# Patient Record
Sex: Female | Born: 1965
Health system: Southern US, Community
[De-identification: ages and names within clinical notes are randomized; demographics above are authoritative.]

## PROBLEM LIST (undated history)

## (undated) DIAGNOSIS — E079 Disorder of thyroid, unspecified: Secondary | ICD-10-CM

---

## 2014-10-30 ENCOUNTER — Other Ambulatory Visit: Payer: Self-pay | Admitting: Obstetrics and Gynecology

## 2014-10-30 DIAGNOSIS — N83299 Other ovarian cyst, unspecified side: Secondary | ICD-10-CM

## 2014-10-30 DIAGNOSIS — N852 Hypertrophy of uterus: Secondary | ICD-10-CM

## 2014-10-31 ENCOUNTER — Other Ambulatory Visit: Payer: Self-pay

## 2014-11-12 ENCOUNTER — Other Ambulatory Visit: Payer: Self-pay

## 2015-03-03 ENCOUNTER — Other Ambulatory Visit: Payer: Self-pay

## 2015-03-06 ENCOUNTER — Ambulatory Visit
Admission: RE | Admit: 2015-03-06 | Discharge: 2015-03-06 | Disposition: A | Discharge status: Home / Self Care | Payer: 59 | Source: Ambulatory Visit | Attending: Obstetrics and Gynecology | Admitting: Obstetrics and Gynecology

## 2015-03-06 DIAGNOSIS — N83299 Other ovarian cyst, unspecified side: Secondary | ICD-10-CM

## 2015-03-06 DIAGNOSIS — N852 Hypertrophy of uterus: Secondary | ICD-10-CM

## 2015-03-06 MED ORDER — IOPAMIDOL (ISOVUE-300) INJECTION 61%
100.0000 mL | Freq: Once | INTRAVENOUS | Status: AC | PRN
  Administered 2015-03-06: 100 mL via INTRAVENOUS

## 2015-04-28 ENCOUNTER — Other Ambulatory Visit: Payer: Self-pay | Admitting: Internal Medicine

## 2015-04-28 DIAGNOSIS — E041 Nontoxic single thyroid nodule: Secondary | ICD-10-CM

## 2015-04-28 DIAGNOSIS — E049 Nontoxic goiter, unspecified: Secondary | ICD-10-CM

## 2015-05-01 ENCOUNTER — Ambulatory Visit
Admission: RE | Admit: 2015-05-01 | Discharge: 2015-05-01 | Disposition: A | Discharge status: Home / Self Care | Payer: 59 | Source: Ambulatory Visit | Attending: Internal Medicine | Admitting: Internal Medicine

## 2015-05-01 DIAGNOSIS — E049 Nontoxic goiter, unspecified: Secondary | ICD-10-CM

## 2015-05-01 DIAGNOSIS — E041 Nontoxic single thyroid nodule: Secondary | ICD-10-CM

## 2015-05-14 ENCOUNTER — Other Ambulatory Visit: Payer: Self-pay | Admitting: Internal Medicine

## 2015-05-14 DIAGNOSIS — E041 Nontoxic single thyroid nodule: Secondary | ICD-10-CM

## 2015-05-20 ENCOUNTER — Ambulatory Visit
Admission: RE | Admit: 2015-05-20 | Discharge: 2015-05-20 | Disposition: A | Discharge status: Home / Self Care | Payer: 59 | Source: Ambulatory Visit | Attending: Internal Medicine | Admitting: Internal Medicine

## 2015-05-20 ENCOUNTER — Other Ambulatory Visit (HOSPITAL_COMMUNITY)
Admission: RE | Admit: 2015-05-20 | Discharge: 2015-05-20 | Disposition: A | Discharge status: Home / Self Care | Payer: 59 | Source: Ambulatory Visit | Attending: Interventional Radiology | Admitting: Interventional Radiology

## 2015-05-20 DIAGNOSIS — E041 Nontoxic single thyroid nodule: Secondary | ICD-10-CM

## 2015-11-10 ENCOUNTER — Emergency Department (HOSPITAL_COMMUNITY)
Admission: EM | Admit: 2015-11-10 | Discharge: 2015-11-10 | Disposition: A | Discharge status: Home / Self Care | Payer: 59 | Attending: Emergency Medicine | Admitting: Emergency Medicine

## 2015-11-10 ENCOUNTER — Encounter (HOSPITAL_COMMUNITY): Payer: Self-pay

## 2015-11-10 DIAGNOSIS — Z041 Encounter for examination and observation following transport accident: Secondary | ICD-10-CM | POA: Insufficient documentation

## 2015-11-10 DIAGNOSIS — Z8639 Personal history of other endocrine, nutritional and metabolic disease: Secondary | ICD-10-CM | POA: Insufficient documentation

## 2015-11-10 DIAGNOSIS — Y998 Other external cause status: Secondary | ICD-10-CM | POA: Insufficient documentation

## 2015-11-10 DIAGNOSIS — Y9389 Activity, other specified: Secondary | ICD-10-CM | POA: Diagnosis not present

## 2015-11-10 DIAGNOSIS — Y92410 Unspecified street and highway as the place of occurrence of the external cause: Secondary | ICD-10-CM | POA: Insufficient documentation

## 2015-11-10 HISTORY — DX: Disorder of thyroid, unspecified: E07.9

## 2015-11-10 MED ORDER — DIAZEPAM 5 MG PO TABS: 5.0000 mg | ORAL_TABLET | Freq: Two times a day (BID) | ORAL | Status: DC

## 2015-11-10 MED ORDER — NAPROXEN 250 MG PO TABS
500.0000 mg | ORAL_TABLET | Freq: Once | ORAL | Status: DC
  Filled 2015-11-10: qty 2

## 2015-11-10 MED ORDER — NAPROXEN 500 MG PO TABS: 500.0000 mg | ORAL_TABLET | Freq: Two times a day (BID) | ORAL | Status: DC
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
## 2018-05-28 DIAGNOSIS — Z1231 Encounter for screening mammogram for malignant neoplasm of breast: Secondary | ICD-10-CM | POA: Diagnosis not present

## 2018-06-20 DIAGNOSIS — Z01419 Encounter for gynecological examination (general) (routine) without abnormal findings: Secondary | ICD-10-CM | POA: Diagnosis not present

## 2018-06-20 DIAGNOSIS — N898 Other specified noninflammatory disorders of vagina: Secondary | ICD-10-CM | POA: Diagnosis not present

## 2018-06-20 DIAGNOSIS — Z6830 Body mass index (BMI) 30.0-30.9, adult: Secondary | ICD-10-CM | POA: Diagnosis not present

## 2018-06-20 DIAGNOSIS — Z1151 Encounter for screening for human papillomavirus (HPV): Secondary | ICD-10-CM | POA: Diagnosis not present

## 2018-06-20 DIAGNOSIS — Z131 Encounter for screening for diabetes mellitus: Secondary | ICD-10-CM | POA: Diagnosis not present

## 2018-06-20 DIAGNOSIS — Z13220 Encounter for screening for lipoid disorders: Secondary | ICD-10-CM | POA: Diagnosis not present

## 2018-06-20 DIAGNOSIS — Z130 Encounter for screening for diseases of the blood and blood-forming organs and certain disorders involving the immune mechanism: Secondary | ICD-10-CM | POA: Diagnosis not present

## 2018-06-20 DIAGNOSIS — Z1329 Encounter for screening for other suspected endocrine disorder: Secondary | ICD-10-CM | POA: Diagnosis not present

## 2018-06-20 DIAGNOSIS — Z0000 Encounter for general adult medical examination without abnormal findings: Secondary | ICD-10-CM | POA: Diagnosis not present

## 2018-06-20 DIAGNOSIS — N760 Acute vaginitis: Secondary | ICD-10-CM | POA: Diagnosis not present

## 2018-07-16 DIAGNOSIS — M79672 Pain in left foot: Secondary | ICD-10-CM | POA: Diagnosis not present

## 2018-07-16 DIAGNOSIS — M79671 Pain in right foot: Secondary | ICD-10-CM | POA: Diagnosis not present

## 2018-07-16 DIAGNOSIS — M722 Plantar fascial fibromatosis: Secondary | ICD-10-CM | POA: Diagnosis not present

## 2018-08-09 DIAGNOSIS — N83201 Unspecified ovarian cyst, right side: Secondary | ICD-10-CM | POA: Diagnosis not present

## 2018-08-09 DIAGNOSIS — D279 Benign neoplasm of unspecified ovary: Secondary | ICD-10-CM | POA: Diagnosis not present

## 2018-08-20 DIAGNOSIS — M722 Plantar fascial fibromatosis: Secondary | ICD-10-CM | POA: Diagnosis not present

## 2018-08-20 DIAGNOSIS — M79671 Pain in right foot: Secondary | ICD-10-CM | POA: Diagnosis not present

## 2018-08-20 DIAGNOSIS — M79672 Pain in left foot: Secondary | ICD-10-CM | POA: Diagnosis not present

## 2018-10-29 ENCOUNTER — Ambulatory Visit (INDEPENDENT_AMBULATORY_CARE_PROVIDER_SITE_OTHER): Payer: BLUE CROSS/BLUE SHIELD | Admitting: Internal Medicine

## 2018-10-29 ENCOUNTER — Encounter (INDEPENDENT_AMBULATORY_CARE_PROVIDER_SITE_OTHER): Payer: Self-pay

## 2018-10-29 ENCOUNTER — Other Ambulatory Visit: Payer: Self-pay

## 2018-10-29 ENCOUNTER — Encounter: Payer: Self-pay | Admitting: Internal Medicine

## 2018-10-29 DIAGNOSIS — E041 Nontoxic single thyroid nodule: Secondary | ICD-10-CM | POA: Diagnosis not present

## 2018-10-29 DIAGNOSIS — E063 Autoimmune thyroiditis: Secondary | ICD-10-CM | POA: Diagnosis not present

## 2018-10-29 NOTE — Patient Instructions (Signed)
Please come back for labs as soon as safe.  I will order a thyroid ultrasound for you.  Please join My chart and I will send you the results through there.  Please come back for a follow-up appointment in 6 months.

## 2018-10-29 NOTE — Progress Notes (Addendum)
Patient ID: Rachel FogoDorothea Spurgeon, female   DOB: 06/07/1966, 53 y.o.   MRN: [id number]   Patient location: Work My location: Office  Referring Provider: Self-referred  I connected with the patient on 10/29/18 at  1:15 PM EDT by a video enabled telemedicine application and verified that I am speaking with the correct person.   I discussed the limitations of evaluation and management by telemedicine and the availability of in person appointments. The patient expressed understanding and agreed to proceed.   Details of the encounter are shown below.   HPI  Rachel Carpenter is a 53 y.o.-year-old female, self-referred, for evaluation and management of a thyroid nodule and euthyroid Hashimoto's thyroiditis.  She was previously seen by Dr. Margaretmary BayleyPreston Clark, who retired a few years ago. She moved to [place] in 2014.  She was diagnosed with thyroid nodules in 2007 by PCP - initially palpated.  Reviewed available study reports: Thyroid U/S (05/01/2015):  Right thyroid lobe: 6.1 x 2.9 x 3.0 cm. Complex predominantly solid lower pole mass measuring 4.0 x 2.9 x 3.1 cm. Left thyroid lobe: 4.1 x 1.5 x 1.4 cm.  No nodules. Isthmus: Thickness 3 mm.  No nodules visualized No lymphadenopathy.    05/20/2015: FNA of the right lower pole thyroid nodule: Benign  Pt denies: - feeling nodules in neck - dysphagia - choking - SOB with lying down But has - hoarseness - ? Allergies.  I reviewed pt's thyroid tests: 05/14/2015: TSH 0.907, total T3 92 (71-180), thyroglobulin 190.8 (1.5-38.5) No results found for: TSH, FREET4   Patient also has Hashimoto's thyroiditis:  05/14/2015: TPO antibodies 246 (0-34), antithyroglobulin antibodies <1.0)  Pt mentions: - no fatigue - + heat intolerance/+ cold intolerance - no tremors - no  palpitations - no anxiety/depression - no hyperdefecation/constipation - no weight loss/weight gain - no dry skin - no hair loss  No FH of thyroid ds. No FH of thyroid cancer. + FH of SLE  in mother. No h/o radiation tx to head or neck.  No seaweed or kelp. No recent contrast studies. No steroid use. No herbal supplements. No Biotin supplements or Hair, Skin and Nails vitamins.  Pt also has a history of HL; R rotator cuff tear; anemia; dermoid cyst on R ovary. She is followed by Dr. Cherly Hensenousins.  She has plantar fasciitis. Sees podiatry.  ROS: Constitutional: + See HPI  Eyes: no blurry vision, no xerophthalmia ENT: no sore throat,  + see HPI Cardiovascular: no CP/SOB/palpitations/+ leg swelling Respiratory: no cough/SOB Gastrointestinal: no N/V/D/C Musculoskeletal: + muscle pain - exercising more/no joint aches, + bone pain Skin: no rashes Neurological: no tremors/numbness/tingling/dizziness Psychiatric: no depression/anxiety  Past Medical History:  Diagnosis Date  . Thyroid disease    No past surgical history on file. Social History   Socioeconomic History  . Marital status: Single    Spouse name: Not on file  . Number of children: 1 son - healthy  . Years of education: Not on file  . Highest education level: Not on file  Occupational History  . Quality manager  Tobacco Use  . Smoking status: Never Smoker  . Smokeless tobacco: Never Used  Substance and Sexual Activity  . Alcohol use: No  . Drug use: No   She exercises 4-5x a week.  Pt is not on any medicines.  No Known Allergies   Family History  Problem Relation Age of Onset  . Hypertension Mother   . Hypertension Father   . Cancer Father   . Hypertension Brother   .  Diabetes Maternal Grandmother   . Hypertension Maternal Grandmother   . Cancer Maternal Grandfather    Please see pertinent FH in HPI.  PE: There were no vitals taken for this visit. Wt Readings from Last 3 Encounters:  11/10/15 182 lb (82.6 kg)   Constitutional:  in NAD  The physical exam was not performed (virtual visit).  ASSESSMENT: 1. Thyroid nodule  2. Hashimoto's thyroiditis  PLAN: 1. Thyroid nodule - I  reviewed the images of her thyroid ultrasound. Tthe dominant nodule is large, this being a risk factor for cancer.  Otherwise, the nodules are: - Mixed: solid + cystic, and hyper + hypoechoic - without microcalcifications - It has some internal blood flow, but not much increased compared to the background - more wide than tall - well delimited from surrounding tissue Pt does not have a thyroid cancer family history or a personal history of RxTx to head/neck. All these would favor benignity.  - The fact that she already had a benign thyroid biopsy is very encouraging - We discussed that we need to repeat an ultrasound and I will order this today.  If the nodule appears unchanged, I would not suggest a new FNA, but we may proceed with this if the nodule appears larger or if it changed ultrasound characteristics. - If the nodule did not change, she should let me know if she develops neck compression symptoms, in that case, we might need to do either lobectomy or thyroidectomy - I will be in touch regarding the thyroid ultrasound results, otherwise, I will see her back in 6 months.  2. Hashimoto's thyroiditis - we had a long discussion about her Hashimoto thyroiditis diagnosis. I explained that this is an autoimmune disorder, in which she develops antibodies against her own thyroid. The antibodies bind to the thyroid tissue and cause inflammation, and, eventually, destruction of the gland and hypothyroidism. We don't know how long this process can be, it can last from months to years. As of now, based on the last results that I have, her thyroid tests are normal.  - thyroid enlargement especially at the beginning of her Hashimoto thyroiditis course is not uncommon, and it has a waxing and waning character.  In her ultrasound report, the right lobe appears larger, but this is most likely due to her large right nodule. - We discussed about treatment for Hashimoto thyroiditis, which is actually limited to  thyroid hormones in case her TFTs are abnormal. Supplements like selenium has been tried with various results, some showing improvement in the TPO antibodies. However, there are no randomized controlled trials of this are consistent results between trials. - We we will check labs when safe to come to the clinic - I advised pt to join my chart and I will send her the results through there   Component     Latest Ref Rng & Units 11/01/2018  Thyroglobulin Ab     < or = 1 IU/mL <1  Thyroperoxidase Ab SerPl-aCnc     <9 IU/mL 853 (H)  Triiodothyronine,Free,Serum     2.3 - 4.2 pg/mL 3.4  T4,Free(Direct)     0.60 - 1.60 ng/dL 9.64  TSH     3.83 - 8.18 uIU/mL 0.77   TFTs are still normal, but her thyroid peroxidase antibodies are higher. Will advise her to try selenium 200 mcg daily. We will recheck her antibodies when she comes back to the clinic in 6 months  Thyroid ultrasound (12/12/2018):  COMPARISON:  05/01/2015; ultrasound-guided right-sided  thyroid nodule fine-needle aspiration-05/20/2015  FINDINGS: Parenchymal Echotexture: Mildly heterogenous Isthmus: Normal in size measures 0.3 cm in diameter, unchanged Right lobe: Enlarged measuring 6.2 x 3.2 x 2.4 cm, unchanged, previously, 6.1 x 2.9 x 3.0 cm Left lobe: Normal in size measuring 4.1 x 1.4 x 1.2 cm, unchanged, previously, 4.1 x 1.5 x 1.4 cm  The previously biopsied approximately 4.0 x 3.3 x 2.7 cm partially cystic though predominantly solid nodule/mass replacing the mid and inferior aspect the right lobe of the thyroid are unchanged compared to the 04/2015 examination, previously, 4.0 x 3.1 x 2.9 cm. Correlation with prior biopsy results is recommended.  IMPRESSION: 1. No new or enlarging thyroid nodules. 2. Previously biopsied nodule/mass replacing the mid and inferior aspect the right lobe of the thyroid are unchanged compared to the 04/2015 examination. Correlation with prior biopsy results is recommended. Assuming a benign  pathologic diagnosis, repeat sampling and/or continued dedicated follow-up is not recommended.  The above is in keeping with the ACR TI-RADS recommendations - J Am Coll Radiol 2017;14:587-595.  Carlus Pavlov, MD PhD [facility]

## 2018-11-01 ENCOUNTER — Other Ambulatory Visit (INDEPENDENT_AMBULATORY_CARE_PROVIDER_SITE_OTHER): Payer: BLUE CROSS/BLUE SHIELD

## 2018-11-01 ENCOUNTER — Encounter: Payer: Self-pay | Admitting: Internal Medicine

## 2018-11-01 ENCOUNTER — Other Ambulatory Visit: Payer: Self-pay

## 2018-11-01 DIAGNOSIS — E063 Autoimmune thyroiditis: Secondary | ICD-10-CM

## 2018-11-01 LAB — T4, FREE: Free T4: 0.83 ng/dL (ref 0.60–1.60)

## 2018-11-01 LAB — TSH: TSH: 0.77 u[IU]/mL (ref 0.35–4.50)

## 2018-11-01 LAB — T3, FREE: T3, Free: 3.4 pg/mL (ref 2.3–4.2)

## 2018-11-02 LAB — THYROID PEROXIDASE ANTIBODY: Thyroperoxidase Ab SerPl-aCnc: 853 IU/mL — ABNORMAL HIGH (ref <9)

## 2018-11-02 LAB — THYROGLOBULIN ANTIBODY: Thyroglobulin Ab: <1 IU/mL (ref <=1)

## 2018-11-27 ENCOUNTER — Encounter: Payer: Self-pay | Admitting: Internal Medicine

## 2018-12-12 ENCOUNTER — Ambulatory Visit
Admission: RE | Admit: 2018-12-12 | Discharge: 2018-12-12 | Disposition: A | Discharge status: Home / Self Care | Payer: BC Managed Care – PPO | Source: Ambulatory Visit | Attending: Internal Medicine | Admitting: Internal Medicine

## 2018-12-12 DIAGNOSIS — E041 Nontoxic single thyroid nodule: Secondary | ICD-10-CM

## 2018-12-12 DIAGNOSIS — M722 Plantar fascial fibromatosis: Secondary | ICD-10-CM | POA: Diagnosis not present

## 2018-12-19 ENCOUNTER — Other Ambulatory Visit: Payer: BLUE CROSS/BLUE SHIELD

## 2019-01-09 ENCOUNTER — Other Ambulatory Visit: Payer: BC Managed Care – PPO

## 2019-01-09 ENCOUNTER — Other Ambulatory Visit: Payer: Self-pay

## 2019-01-09 DIAGNOSIS — R6889 Other general symptoms and signs: Secondary | ICD-10-CM | POA: Diagnosis not present

## 2019-01-09 DIAGNOSIS — Z20822 Contact with and (suspected) exposure to covid-19: Secondary | ICD-10-CM

## 2019-01-16 LAB — NOVEL CORONAVIRUS, NAA: SARS-CoV-2, NAA: NOT DETECTED

## 2019-04-29 ENCOUNTER — Other Ambulatory Visit: Payer: Self-pay

## 2019-04-29 ENCOUNTER — Ambulatory Visit (INDEPENDENT_AMBULATORY_CARE_PROVIDER_SITE_OTHER): Payer: BC Managed Care – PPO | Admitting: Internal Medicine

## 2019-04-29 ENCOUNTER — Encounter: Payer: Self-pay | Admitting: Internal Medicine

## 2019-04-29 VITALS — BP 128/80 | HR 65 | Ht 66.73 in | Wt 195.0 lb

## 2019-04-29 DIAGNOSIS — E063 Autoimmune thyroiditis: Secondary | ICD-10-CM

## 2019-04-29 DIAGNOSIS — M2550 Pain in unspecified joint: Secondary | ICD-10-CM | POA: Diagnosis not present

## 2019-04-29 DIAGNOSIS — E041 Nontoxic single thyroid nodule: Secondary | ICD-10-CM | POA: Diagnosis not present

## 2019-04-29 NOTE — Progress Notes (Signed)
Patient ID: Rachel Carpenter, female   DOB: 01/10/1966, 53 y.o.   MRN: [id number]   HPI  Rachel Carpenter is a 53 y.o.-year-old female, self-referred, for evaluation and management of a thyroid nodule and euthyroid Hashimoto's thyroiditis.  She was previously seen by Dr. Margaretmary BayleyPreston Clark, who retired a few years ago. She moved to [place] in 2014.  Our first visit was 6 months ago (virtual).  Thyroid nodules   - dx'ed in 2007 by PCP by palpation.  Reviewed available study reports and images, Thyroid U/S (05/01/2015):  Right thyroid lobe: 6.1 x 2.9 x 3.0 cm. Complex predominantly solid lower pole mass measuring 4.0 x 2.9 x 3.1 cm. Left thyroid lobe: 4.1 x 1.5 x 1.4 cm.  No nodules. Isthmus: Thickness 3 mm.  No nodules visualized No lymphadenopathy.     05/20/2015: FNA of the right lower pole thyroid nodule: Benign  Thyroid ultrasound (12/12/2018):  Parenchymal Echotexture: Mildly heterogenous Isthmus: Normal in size measures 0.3 cm in diameter, unchanged Right lobe: Enlarged measuring 6.2 x 3.2 x 2.4 cm, unchanged, previously, 6.1 x 2.9 x 3.0 cm Left lobe: Normal in size measuring 4.1 x 1.4 x 1.2 cm, unchanged, previously, 4.1 x 1.5 x 1.4 cm  The previously biopsied approximately 4.0 x 3.3 x 2.7 cm partially cystic though predominantly solid nodule/mass replacing the mid and inferior aspect the right lobe of the thyroid are unchanged compared to the 04/2015 examination, previously, 4.0 x 3.1 x 2.9 cm. Correlation with prior biopsy results is recommended.  IMPRESSION: 1. No new or enlarging thyroid nodules. 2. Previously biopsied nodule/mass replacing the mid and inferior aspect the right lobe of the thyroid are unchanged compared to the 04/2015 examination. Correlation with prior biopsy results is recommended. Assuming a benign pathologic diagnosis, repeat sampling and/or continued dedicated follow-up is not recommended.  The above is in keeping with the ACR TI-RADS recommendations - J  Am Coll Radiol 2017;14:587-595.  Pt denies: - feeling nodules in neck - hoarseness - dysphagia - choking - SOB with lying down  I reviewed patient's TFTs: Lab Results  Component Value Date   TSH 0.77 11/01/2018   FREET4 0.83 11/01/2018  05/14/2015: TSH 0.907, total T3 92 (71-180), thyroglobulin 190.8 (1.5-38.5)  Patient also has Hashimoto's thyroiditis.    At last visit her antibodies were higher and I suggested selenium 200 mcg daily.  She is taking this now.  Component     Latest Ref Rng & Units 11/01/2018  Thyroglobulin Ab     < or = 1 IU/mL <1  Thyroperoxidase Ab SerPl-aCnc     <9 IU/mL 853 (H)  05/14/2015: TPO antibodies 246 (0-34), antithyroglobulin antibodies <1.0  No family history of thyroid disease.  + FH of SLE, RA in mother. No FH of thyroid cancer. No h/o radiation tx to head or neck.  No seaweed or kelp. No recent contrast studies. No herbal supplements. No Biotin use. No recent steroids use.   Pt also has a history of HL; R rotator cuff tear; anemia; dermoid cyst on R ovary. She is followed by Dr. Cherly Hensenousins.  She has plantar fasciitis.  She sees podiatry.  ROS: Constitutional: no weight gain/no weight loss, no fatigue, no subjective hyperthermia, no subjective hypothermia Eyes: no blurry vision, no xerophthalmia ENT: no sore throat, + see HPI Cardiovascular: no CP/no SOB/no palpitations/no leg swelling Respiratory: no cough/no SOB/no wheezing Gastrointestinal: no N/no V/no D/no C/no acid reflux Musculoskeletal: no muscle aches/+ joint aches Skin: no rashes, no hair loss Neurological: no tremors/no  numbness/no tingling/no dizziness  I reviewed pt's medications, allergies, PMH, social hx, family hx, and changes were documented in the history of present illness. Otherwise, unchanged from my initial visit note.  Past Medical History:  Diagnosis Date  . Thyroid disease    No past surgical history on file. Social History   Socioeconomic History  . Marital  status: Single    Spouse name: Not on file  . Number of children: 1 son - healthy  . Years of education: Not on file  . Highest education level: Not on file  Occupational History  . Quality manager  Tobacco Use  . Smoking status: Never Smoker  . Smokeless tobacco: Never Used  Substance and Sexual Activity  . Alcohol use: No  . Drug use: No   She exercises 4-5x a week.  Pt is not on any medicines.  No Known Allergies   Family History  Problem Relation Age of Onset  . Hypertension Mother   . Hypertension Father   . Cancer Father   . Hypertension Brother   . Diabetes Maternal Grandmother   . Hypertension Maternal Grandmother   . Cancer Maternal Grandfather    Please see pertinent FH in HPI.  PE: BP 128/80   Pulse 65   Ht 5' 6.73" (1.695 m) Comment: measured today without shoes  Wt 195 lb (88.5 kg)   LMP 02/16/2015   SpO2 99%   BMI 30.79 kg/m  Wt Readings from Last 3 Encounters:  04/29/19 195 lb (88.5 kg)  11/10/15 182 lb (82.6 kg)   Constitutional: overweight, in NAD Eyes: PERRLA, EOMI, no exophthalmos ENT: moist mucous membranes, no thyromegaly, no cervical lymphadenopathy Cardiovascular: RRR, No MRG Respiratory: CTA B Gastrointestinal: abdomen soft, NT, ND, BS+ Musculoskeletal: no deformities, strength intact in all 4 Skin: moist, warm, no rashes Neurological: no tremor with outstretched hands, DTR normal in all 4   ASSESSMENT: 1. Thyroid nodule  2. Hashimoto's thyroiditis  3.  Joint pains  PLAN: 1. Thyroid nodule -I reviewed the report and the images of her thyroid ultrasound.  The dominant nodule is large, this being a risk factor for cancer.  However, this was biopsied in 2016 with benign results.  A repeat ultrasound obtained after our last visit (12/12/2018) showed stability of the nodule. The nodules are: -Next: solid + cystic, and hyper + hypoechoic -Without microcalcifications -With some internal blood flow but not much increased compared to  background -More wide than tall -Well limited from surrounding tissue She does not have a thyroid cancer family history or personal history of radiation therapy to head or neck. -At this visit, she does not have neck compression symptoms -We discussed about just following the nodules clinically for now and she will need to let me know if she develops any neck compression symptoms at her next visit. -Plan to repeat another thyroid ultrasound then -I will see her back in 8 mo  2. Hashimoto's thyroiditis -Patient with history of Hashimoto's thyroiditis but with normal TFTs at last visit.  Her TPO antibodies were higher at last check in 10/2018 we discussed that this implies increased inflammation of her thyroid.  However, for now, she does not require thyroid hormones.  I did suggest to start selenium 200 mcg daily, which was shown to decrease antithyroid antibodies in some cases. -At this visit, she does not have any signs and symptoms of hypo or hyperthyroidism, except for 12 lbs weight gain -We will recheck her TFTs today and also her TPO antibodies -I  will send her the results through my chart -We will repeat her tests in  8 mo when she returns  3. Joint pains -Patient has longstanding generalized joint pains.  Her mother had SLE and RA.  She is worried that she may have an inflammatory arthritis and wonders whether a bone density scan would be indicated.  We discussed that bone density scan will show osteoporosis, but not OA or RA.  Also, no connection with Hashimoto's thyroiditis. -I directed her to PCP as she may need a referral to rheumatology.  Component     Latest Ref Rng & Units 04/29/2019  Thyroperoxidase Ab SerPl-aCnc     <9 IU/mL 706 (H)  Triiodothyronine,Free,Serum     2.3 - 4.2 pg/mL 3.1  T4,Free(Direct)     0.60 - 1.60 ng/dL 0.74  TSH     0.35 - 4.50 uIU/mL 1.17  Tests are normal, with exception of TPO antibodies, which are still high.  I would probably suggest to continue  the selenium for now.  Philemon Kingdom, MD PhD [facility]

## 2019-04-29 NOTE — Patient Instructions (Addendum)
Please stop at the lab.  Continue Selenium 200 mcg daily.  Please come back for a follow-up appointment in June. Send me a message at the end of May to order the new thyroid U/S.

## 2019-04-30 LAB — THYROID PEROXIDASE ANTIBODY: Thyroperoxidase Ab SerPl-aCnc: 706 IU/mL — ABNORMAL HIGH (ref <9)

## 2019-04-30 LAB — TSH: TSH: 1.17 u[IU]/mL (ref 0.35–4.50)

## 2019-04-30 LAB — T4, FREE: Free T4: 0.74 ng/dL (ref 0.60–1.60)

## 2019-04-30 LAB — T3, FREE: T3, Free: 3.1 pg/mL (ref 2.3–4.2)

## 2019-05-17 DIAGNOSIS — M722 Plantar fascial fibromatosis: Secondary | ICD-10-CM | POA: Diagnosis not present

## 2019-05-27 DIAGNOSIS — M25511 Pain in right shoulder: Secondary | ICD-10-CM | POA: Diagnosis not present

## 2019-05-27 DIAGNOSIS — M25512 Pain in left shoulder: Secondary | ICD-10-CM | POA: Diagnosis not present

## 2019-05-30 DIAGNOSIS — Z1231 Encounter for screening mammogram for malignant neoplasm of breast: Secondary | ICD-10-CM | POA: Diagnosis not present

## 2019-06-28 DIAGNOSIS — Z1151 Encounter for screening for human papillomavirus (HPV): Secondary | ICD-10-CM | POA: Diagnosis not present

## 2019-06-28 DIAGNOSIS — Z0000 Encounter for general adult medical examination without abnormal findings: Secondary | ICD-10-CM | POA: Diagnosis not present

## 2019-06-28 DIAGNOSIS — Z6830 Body mass index (BMI) 30.0-30.9, adult: Secondary | ICD-10-CM | POA: Diagnosis not present

## 2019-06-28 DIAGNOSIS — Z1329 Encounter for screening for other suspected endocrine disorder: Secondary | ICD-10-CM | POA: Diagnosis not present

## 2019-06-28 DIAGNOSIS — Z131 Encounter for screening for diabetes mellitus: Secondary | ICD-10-CM | POA: Diagnosis not present

## 2019-06-28 DIAGNOSIS — Z13220 Encounter for screening for lipoid disorders: Secondary | ICD-10-CM | POA: Diagnosis not present

## 2019-06-28 DIAGNOSIS — Z130 Encounter for screening for diseases of the blood and blood-forming organs and certain disorders involving the immune mechanism: Secondary | ICD-10-CM | POA: Diagnosis not present

## 2019-06-28 DIAGNOSIS — Z01419 Encounter for gynecological examination (general) (routine) without abnormal findings: Secondary | ICD-10-CM | POA: Diagnosis not present

## 2019-09-20 IMAGING — US US THYROID
1 series · 13 of 25 positions shown · non-contrast
Comparison: 05/01/2015; ultrasound-guided right-sided thyroid
nodule fine-needle aspiration-05/20/2015

CLINICAL DATA: Goiter. History of right-sided thyroid nodule
fine-needle aspiration performed 05/20/2015

EXAM:
THYROID ULTRASOUND
TECHNIQUE: Ultrasound examination of the thyroid gland and adjacent soft
tissues was performed.

[Series 1: us thyroid · 0.06mm/px · 13 of 40 slices shown]
[im 1/40]
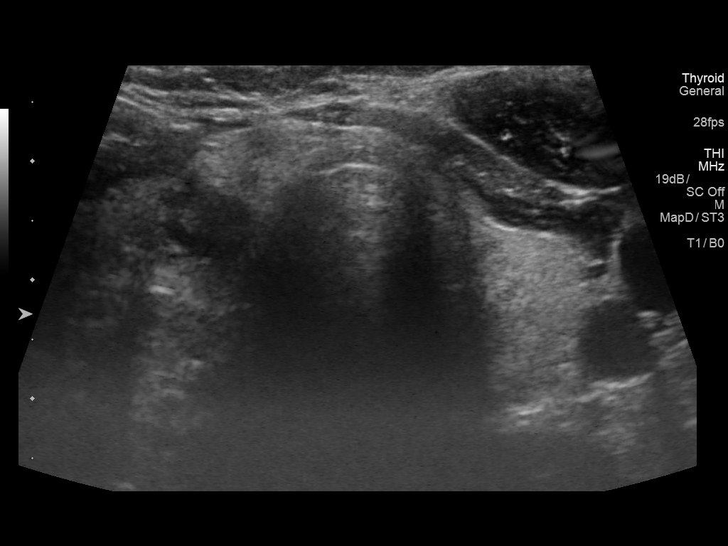
[im 4/40]
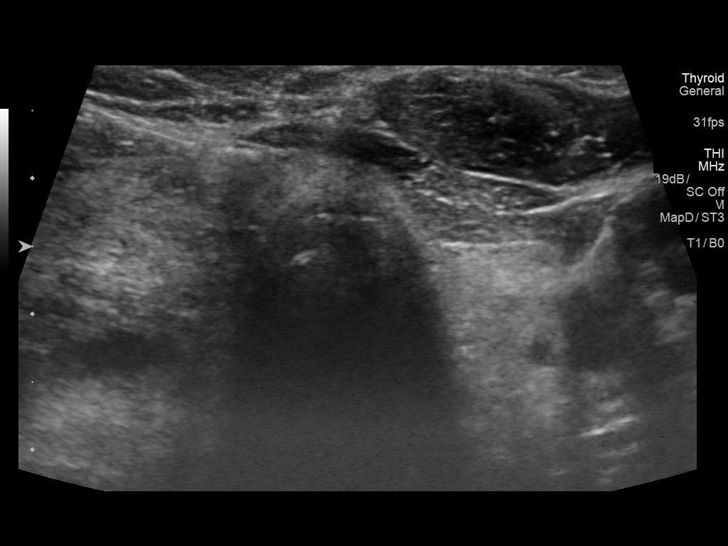
[im 7/40]
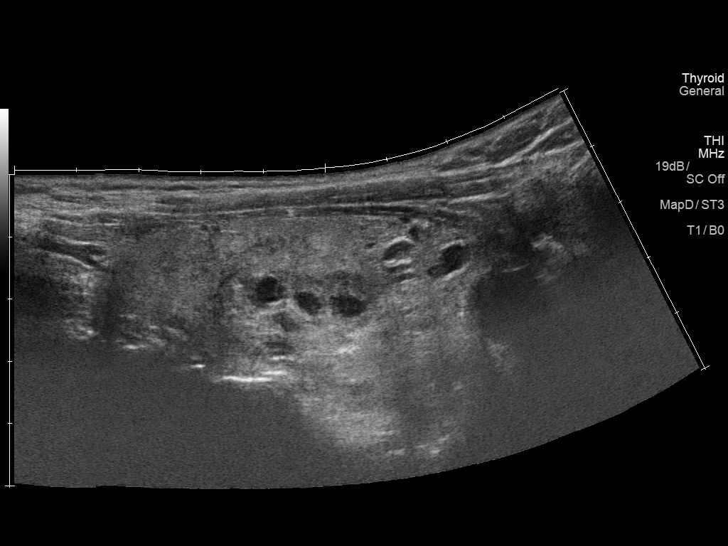
[im 10/40]
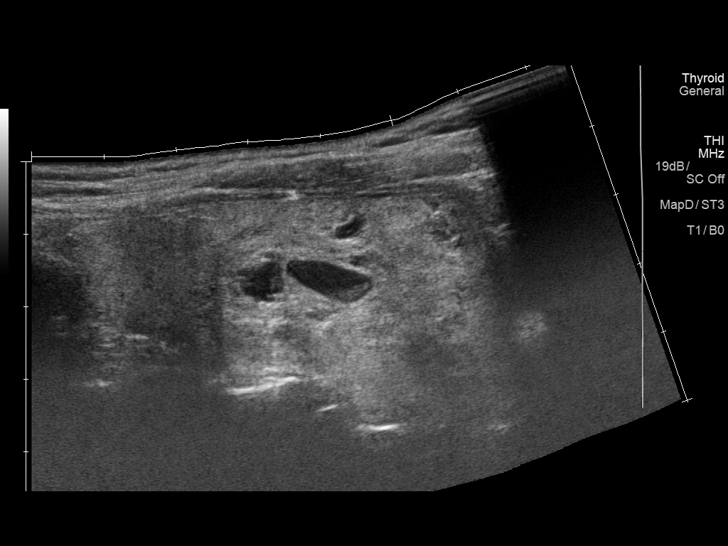
[im 14/40]
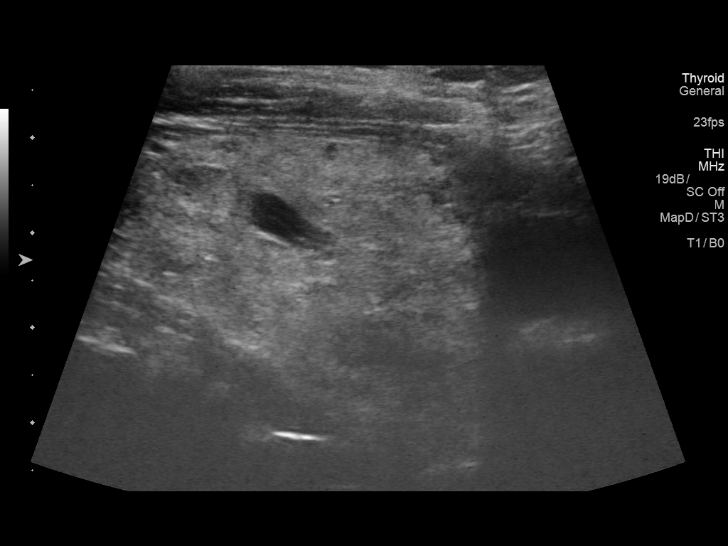
[im 17/40]
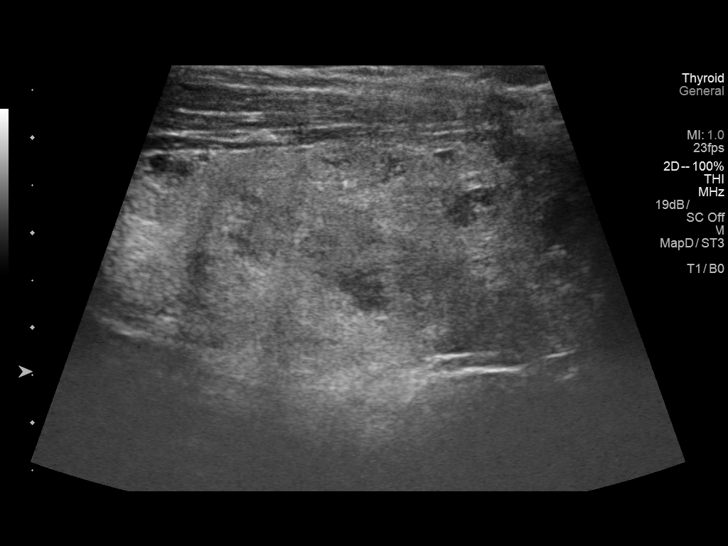
[im 20/40]
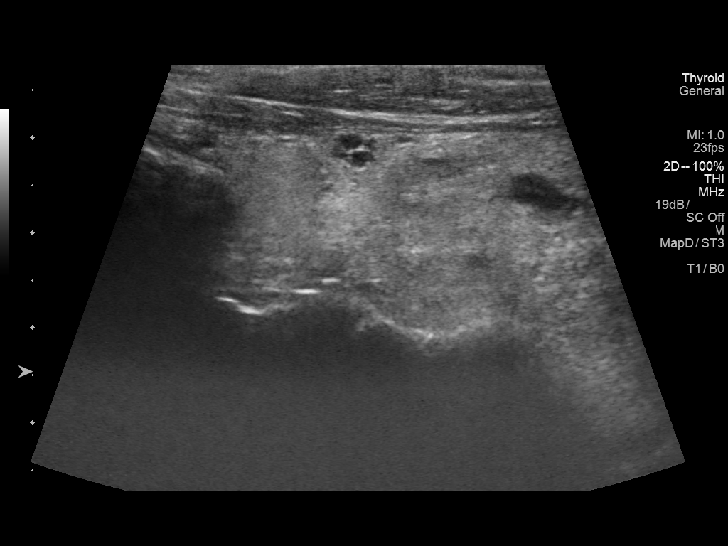
[im 23/40]
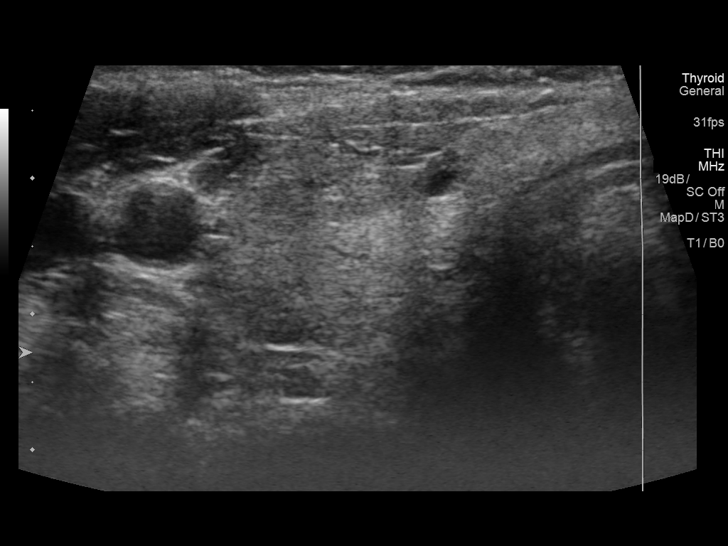
[im 27/40]
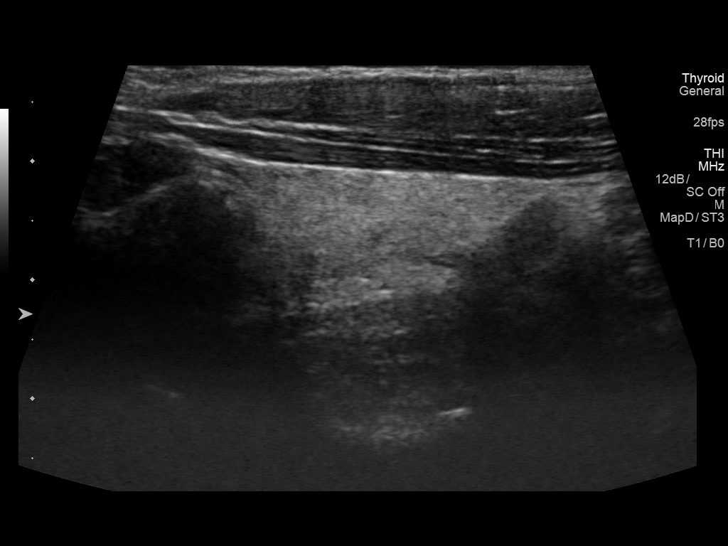
[im 30/40]
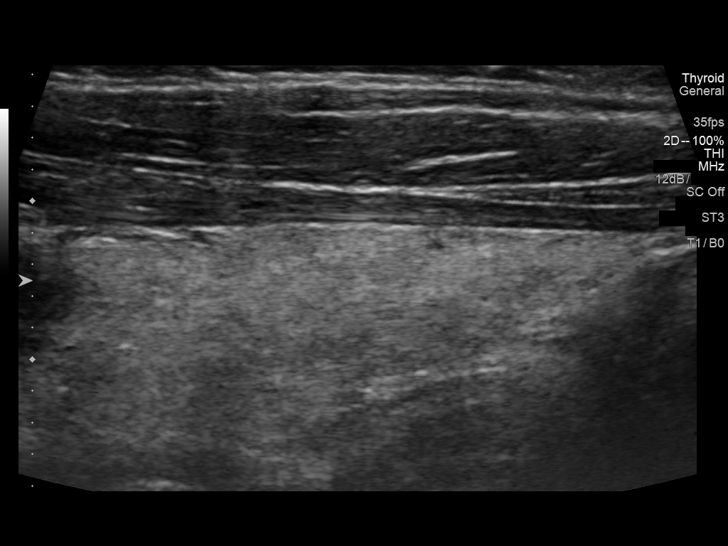
[im 33/40]
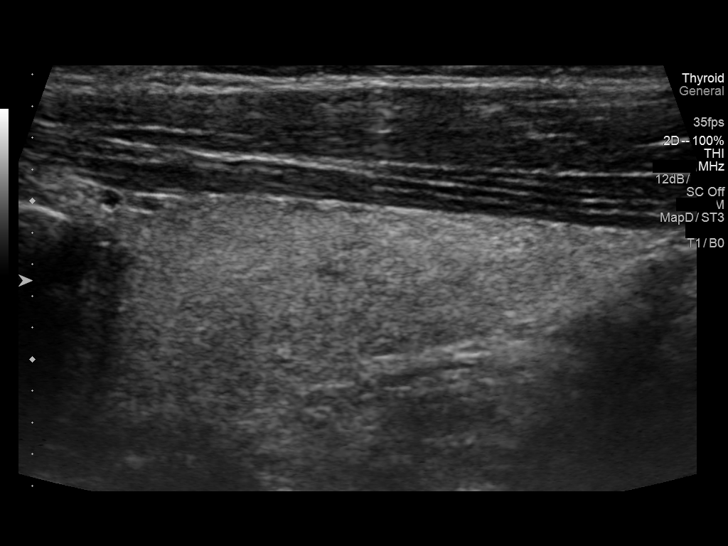
[im 36/40]
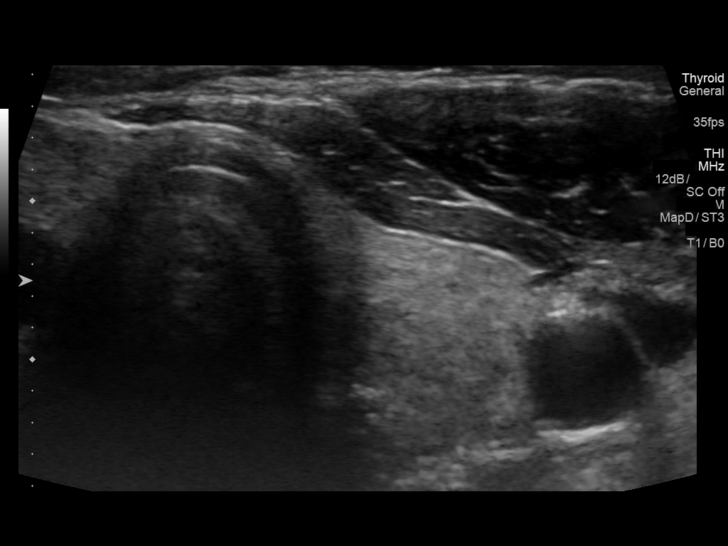
[im 40/40]
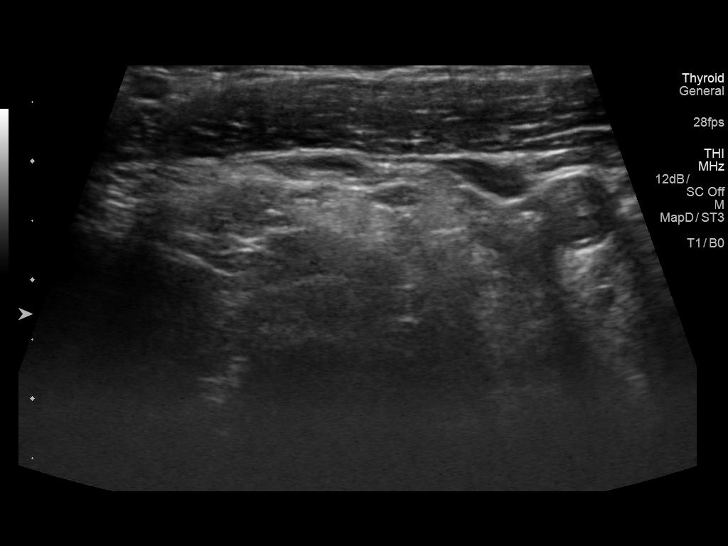

[13 of 25 positions shown; findings below may reference images not displayed]

FINDINGS: Parenchymal Echotexture: Mildly heterogenous

Isthmus: Normal in size measures 0.3 cm in diameter, unchanged

Right lobe: Enlarged measuring 6.2 x 3.2 x 2.4 cm, unchanged,
previously, 6.1 x 2.9 x 3.0 cm

Left lobe: Normal in size measuring 4.1 x 1.4 x 1.2 cm, unchanged,
previously, 4.1 x 1.5 x 1.4 cm

_________________________________________________________

Estimated total number of nodules >/= 1 cm: 1

Number of spongiform nodules >/=  2 cm not described below (TR1): 0

Number of mixed cystic and solid nodules >/= 1.5 cm not described
below (TR2): 0

_________________________________________________________

The previously biopsied approximately 4.0 x 3.3 x 2.7 cm partially
cystic though predominantly solid nodule/mass replacing the mid and
inferior aspect the right lobe of the thyroid are unchanged compared
to the [DATE] examination, previously, 4.0 x 3.1 x 2.9 cm.
Correlation with prior biopsy results is recommended.
IMPRESSION: 1. No new or enlarging thyroid nodules.
2. Previously biopsied nodule/mass replacing the mid and inferior
aspect the right lobe of the thyroid are unchanged compared to the
[DATE] examination. Correlation with prior biopsy results is
recommended. Assuming a benign pathologic diagnosis, repeat sampling
and/or continued dedicated follow-up is not recommended.

The above is in keeping with the ACR TI-RADS recommendations - [HOSPITAL] 1987;[DATE].

## 2019-12-12 ENCOUNTER — Telehealth: Payer: Self-pay | Admitting: Internal Medicine

## 2019-12-12 NOTE — Telephone Encounter (Signed)
Patient requests to be called at ph# [phone] re: Patient has appointment with Dr. Elvera Lennox on 01/07/20 and requests to be called re: does Dr. Elvera Lennox want patient to have a Thyroid scan and thyroid labs done prior to patient's appointment with Dr. Elvera Lennox.

## 2019-12-12 NOTE — Telephone Encounter (Signed)
Last chart note reviewed, Dr. Elvera Lennox wanted to see her first.

## 2020-01-07 ENCOUNTER — Other Ambulatory Visit: Payer: Self-pay

## 2020-01-07 ENCOUNTER — Encounter: Payer: Self-pay | Admitting: Internal Medicine

## 2020-01-07 ENCOUNTER — Ambulatory Visit: Payer: 59 | Admitting: Internal Medicine

## 2020-01-07 VITALS — BP 120/78 | HR 83 | Ht 66.75 in | Wt 192.8 lb

## 2020-01-07 DIAGNOSIS — E063 Autoimmune thyroiditis: Secondary | ICD-10-CM

## 2020-01-07 DIAGNOSIS — E041 Nontoxic single thyroid nodule: Secondary | ICD-10-CM

## 2020-01-07 NOTE — Patient Instructions (Signed)
Please stop at the lab.  Please come back for a follow-up appointment in 1 year.  

## 2020-01-07 NOTE — Progress Notes (Addendum)
Patient ID: Rachel Carpenter, female   DOB: [birth year], 54 y.o.   MRN: [id number]   This visit occurred during the SARS-CoV-2 public health emergency.  Safety protocols were in place, including screening questions prior to the visit, additional usage of staff PPE, and extensive cleaning of exam room while observing appropriate contact time as indicated for disinfecting solutions.   HPI  Rachel Carpenter is a 54 y.o.-year-old female, self-referred, for evaluation and management of a thyroid nodule and euthyroid Hashimoto's thyroiditis.  She was previously seen by Dr. Margaretmary Bayley, who retired a few years ago. She moved to [place] in 2014.  Last  visit was 8 months ago.  Thyroid nodules   - dx'ed in 2007 by PCP by palpation.  Reviewed available study report and images: Thyroid U/S (05/01/2015):  Right thyroid lobe: 6.1 x 2.9 x 3.0 cm. Complex predominantly solid lower pole mass measuring 4.0 x 2.9 x 3.1 cm. Left thyroid lobe: 4.1 x 1.5 x 1.4 cm.  No nodules. Isthmus: Thickness 3 mm.  No nodules visualized No lymphadenopathy.     05/20/2015: FNA of the right lower pole thyroid nodule: Benign  Thyroid ultrasound (12/12/2018):  Parenchymal Echotexture: Mildly heterogenous Isthmus: Normal in size measures 0.3 cm in diameter, unchanged Right lobe: Enlarged measuring 6.2 x 3.2 x 2.4 cm, unchanged, previously, 6.1 x 2.9 x 3.0 cm Left lobe: Normal in size measuring 4.1 x 1.4 x 1.2 cm, unchanged, previously, 4.1 x 1.5 x 1.4 cm  The previously biopsied approximately 4.0 x 3.3 x 2.7 cm partially cystic though predominantly solid nodule/mass replacing the mid and inferior aspect the right lobe of the thyroid are unchanged compared to the 04/2015 examination, previously, 4.0 x 3.1 x 2.9 cm. Correlation with prior biopsy results is recommended.  IMPRESSION: 1. No new or enlarging thyroid nodules. 2. Previously biopsied nodule/mass replacing the mid and inferior aspect the right lobe of the thyroid  are unchanged compared to the 04/2015 examination. Correlation with prior biopsy results is recommended. Assuming a benign pathologic diagnosis, repeat sampling and/or continued dedicated follow-up is not recommended.  The above is in keeping with the ACR TI-RADS recommendations - J Am Coll Radiol 2017;14:587-595.  Pt denies: - feeling nodules in neck - hoarseness - dysphagia - choking - SOB with lying down  Reviewed patient's TFTs: Lab Results  Component Value Date   TSH 1.17 04/29/2019   TSH 0.77 11/01/2018   FREET4 0.74 04/29/2019   FREET4 0.83 11/01/2018  05/14/2015: TSH 0.907, total T3 92 (71-180), thyroglobulin 190.8 (1.5-38.5)  Hashimoto's thyroiditis.    Reviewed previous thyroid antibody levels: Component     Latest Ref Rng & Units 04/29/2019  Thyroperoxidase Ab SerPl-aCnc     <9 IU/mL 706 (H)   Component     Latest Ref Rng & Units 11/01/2018  Thyroglobulin Ab     < or = 1 IU/mL <1  Thyroperoxidase Ab SerPl-aCnc     <9 IU/mL 853 (H)  05/14/2015: TPO antibodies 246 (0-34), antithyroglobulin antibodies <1.0  We started selenium 200 mcg daily 10/2018.  She continues on this.  No family history of thyroid disease.  She has positive family history of autoimmune diseases (SLE, RA) mother. No FH of thyroid cancer. No h/o radiation tx to head or neck.  No seaweed or kelp. No recent contrast studies. No herbal supplements. No Biotin use. No recent steroids use.   Pt also has a history of HL; R rotator cuff tear; anemia; dermoid cyst on R ovary. She is followed  by Dr. Cherly Hensen.  She has plantar fasciitis.  She sees podiatry.  ROS: Constitutional: no weight gain/no weight loss, no fatigue, no subjective hyperthermia, no subjective hypothermia Eyes: no blurry vision, no xerophthalmia ENT: no sore throat, + see HPI Cardiovascular: no CP/no SOB/no palpitations/no leg swelling Respiratory: no cough/no SOB/no wheezing Gastrointestinal: no N/no V/no D/no C/no acid  reflux Musculoskeletal: no muscle aches/+ joint aches, + L upper leg cramps Skin: no rashes, no hair loss Neurological: no tremors/no numbness/no tingling/no dizziness  I reviewed pt's medications, allergies, PMH, social hx, family hx, and changes were documented in the history of present illness. Otherwise, unchanged from my initial visit note.  Past Medical History:  Diagnosis Date  . Thyroid disease    No past surgical history on file. Social History   Socioeconomic History  . Marital status: Single    Spouse name: Not on file  . Number of children: 1 son - healthy  . Years of education: Not on file  . Highest education level: Not on file  Occupational History  . Quality manager  Tobacco Use  . Smoking status: Never Smoker  . Smokeless tobacco: Never Used  Substance and Sexual Activity  . Alcohol use: No  . Drug use: No   She exercises 4-5x a week.  Pt is not on any medicines.  No Known Allergies   Family History  Problem Relation Age of Onset  . Hypertension Mother   . Hypertension Father   . Cancer Father   . Hypertension Brother   . Diabetes Maternal Grandmother   . Hypertension Maternal Grandmother   . Cancer Maternal Grandfather    Please see pertinent FH in HPI.  PE: BP 120/78   Pulse 83   Ht 5' 6.75" (1.695 m)   Wt 192 lb 12.8 oz (87.5 kg)   LMP 02/16/2015   SpO2 98%   BMI 30.42 kg/m  Wt Readings from Last 3 Encounters:  01/07/20 192 lb 12.8 oz (87.5 kg)  04/29/19 195 lb (88.5 kg)  11/10/15 182 lb (82.6 kg)   Constitutional: overweight, in NAD Eyes: PERRLA, EOMI, no exophthalmos ENT: moist mucous membranes, no thyromegaly, no cervical lymphadenopathy Cardiovascular: RRR, No MRG Respiratory: CTA B Gastrointestinal: abdomen soft, NT, ND, BS+ Musculoskeletal: no deformities, strength intact in all 4 Skin: moist, warm, no rashes Neurological: no tremor with outstretched hands, DTR normal in all 4  ASSESSMENT: 1. Thyroid nodule  2.  Hashimoto's thyroiditis  PLAN: 1. Thyroid nodule -I reviewed the report and images of her thyroid ultrasound from 04/2019: The dominant nodule is large, this being a risk factor for cancer.  However, this was biopsied in 2016 with benign results. The last ultrasound from 12/12/2018 shows stability of the nodule. Otherwise, the nodule is not worrisome: -solid + cystic, and hyper + hypoechoic -Without microcalcifications -With some internal blood flow but not much increased compared to background -More wide than tall -Well limited from surrounding tissue She does not have a thyroid cancer family history or personal history of radiation therapy to head or neck, so she is not at a particular high risk for thyroid cancer -At this visit, he denies any neck compression symptoms -At next visit we will repeat another ultrasound -I will see her back in a year  2. Hashimoto's thyroiditis -Patient with history of Hashimoto's thyroiditis but normal TFTs at last visit. -Her TPO antibodies were high in 10/2018 and we started selenium 200 mcg daily. -At last visit, in 04/2019, TPO antibodies were still high,  but slightly better.  We continue selenium. -She is asymptomatic, without complaints at today's visit -We will recheck her TFTs and TPO antibodies today  Component     Latest Ref Rng & Units 01/07/2020  Thyroperoxidase Ab SerPl-aCnc     <9 IU/mL 709 (H)  Triiodothyronine,Free,Serum     2.3 - 4.2 pg/mL 2.8  T4,Free(Direct)     0.60 - 1.60 ng/dL 9.79  TSH     8.92 - 1.19 uIU/mL 0.58  TPO antibodies are still high, unchanged.  At this point, decided that she wants to continue with selenium or not.  TFTs are normal.   Carlus Pavlov, MD PhD [facility] Endocrinology

## 2020-01-08 ENCOUNTER — Encounter: Payer: Self-pay | Admitting: Internal Medicine

## 2020-01-08 LAB — T3, FREE: T3, Free: 2.8 pg/mL (ref 2.3–4.2)

## 2020-01-08 LAB — TSH: TSH: 0.58 u[IU]/mL (ref 0.35–4.50)

## 2020-01-08 LAB — T4, FREE: Free T4: 0.78 ng/dL (ref 0.60–1.60)

## 2020-01-08 LAB — THYROID PEROXIDASE ANTIBODY: Thyroperoxidase Ab SerPl-aCnc: 709 IU/mL — ABNORMAL HIGH (ref <9)

## 2020-03-19 ENCOUNTER — Other Ambulatory Visit: Payer: 59

## 2020-03-19 ENCOUNTER — Other Ambulatory Visit: Payer: Self-pay

## 2020-03-19 DIAGNOSIS — Z20822 Contact with and (suspected) exposure to covid-19: Secondary | ICD-10-CM

## 2020-03-21 LAB — SARS-COV-2, NAA 2 DAY TAT

## 2020-03-21 LAB — NOVEL CORONAVIRUS, NAA: SARS-CoV-2, NAA: NOT DETECTED

## 2020-07-15 ENCOUNTER — Other Ambulatory Visit: Payer: Self-pay | Admitting: Radiology

## 2021-01-04 ENCOUNTER — Telehealth: Payer: Self-pay | Admitting: Internal Medicine

## 2021-01-04 NOTE — Telephone Encounter (Signed)
Pt would like to see if Dr. Elvera Lennox is open to putting in lab orders so she can have her lab appt done before her Dr appt on 7/12? Pt really would like to just speak with the Dr in person regarding her results this time instead of just getting a call to report her results.

## 2021-01-07 ENCOUNTER — Other Ambulatory Visit: Payer: Self-pay | Admitting: Internal Medicine

## 2021-01-07 DIAGNOSIS — E063 Autoimmune thyroiditis: Secondary | ICD-10-CM

## 2021-01-07 NOTE — Telephone Encounter (Signed)
LVM to schedule lab appt

## 2021-01-07 NOTE — Telephone Encounter (Signed)
OK, I ordered them.

## 2021-01-07 NOTE — Telephone Encounter (Signed)
Pt is requesting  call from you regarding results.

## 2021-01-08 ENCOUNTER — Ambulatory Visit: Payer: 59 | Admitting: Internal Medicine

## 2021-01-13 ENCOUNTER — Other Ambulatory Visit: Payer: Self-pay

## 2021-01-13 ENCOUNTER — Other Ambulatory Visit: Payer: 59

## 2021-01-13 ENCOUNTER — Other Ambulatory Visit (INDEPENDENT_AMBULATORY_CARE_PROVIDER_SITE_OTHER): Payer: 59

## 2021-01-13 DIAGNOSIS — E063 Autoimmune thyroiditis: Secondary | ICD-10-CM | POA: Diagnosis not present

## 2021-01-13 LAB — TSH: TSH: 1.28 u[IU]/mL (ref 0.35–5.50)

## 2021-01-13 LAB — T3, FREE: T3, Free: 3.5 pg/mL (ref 2.3–4.2)

## 2021-01-13 LAB — T4, FREE: Free T4: 0.87 ng/dL (ref 0.60–1.60)

## 2021-01-14 LAB — THYROID PEROXIDASE ANTIBODY: Thyroperoxidase Ab SerPl-aCnc: >900 IU/mL — ABNORMAL HIGH (ref <9)

## 2021-01-14 LAB — THYROGLOBULIN ANTIBODY: Thyroglobulin Ab: <1 IU/mL (ref <=1)

## 2021-01-19 ENCOUNTER — Ambulatory Visit: Payer: 59 | Admitting: Internal Medicine

## 2021-03-08 ENCOUNTER — Ambulatory Visit
Admission: RE | Admit: 2021-03-08 | Discharge: 2021-03-08 | Disposition: A | Discharge status: Home / Self Care | Payer: 59 | Source: Ambulatory Visit | Attending: Obstetrics and Gynecology | Admitting: Obstetrics and Gynecology

## 2021-03-08 ENCOUNTER — Other Ambulatory Visit: Payer: Self-pay | Admitting: Obstetrics and Gynecology

## 2021-03-08 DIAGNOSIS — N83201 Unspecified ovarian cyst, right side: Secondary | ICD-10-CM

## 2021-09-21 ENCOUNTER — Other Ambulatory Visit: Payer: Self-pay | Admitting: Internal Medicine

## 2021-09-21 DIAGNOSIS — R1909 Other intra-abdominal and pelvic swelling, mass and lump: Secondary | ICD-10-CM

## 2021-09-21 DIAGNOSIS — E041 Nontoxic single thyroid nodule: Secondary | ICD-10-CM

## 2021-10-04 ENCOUNTER — Other Ambulatory Visit: Payer: 59

## 2021-10-05 ENCOUNTER — Ambulatory Visit
Admission: RE | Admit: 2021-10-05 | Discharge: 2021-10-05 | Disposition: A | Discharge status: Home / Self Care | Payer: Managed Care, Other (non HMO) | Source: Ambulatory Visit | Attending: Internal Medicine | Admitting: Internal Medicine

## 2021-10-05 ENCOUNTER — Ambulatory Visit
Admission: RE | Admit: 2021-10-05 | Discharge: 2021-10-05 | Disposition: A | Discharge status: Home / Self Care | Payer: 59 | Source: Ambulatory Visit | Attending: Internal Medicine | Admitting: Internal Medicine

## 2021-10-05 DIAGNOSIS — R1909 Other intra-abdominal and pelvic swelling, mass and lump: Secondary | ICD-10-CM

## 2021-10-05 DIAGNOSIS — E041 Nontoxic single thyroid nodule: Secondary | ICD-10-CM

## 2022-04-22 ENCOUNTER — Ambulatory Visit: Payer: Managed Care, Other (non HMO) | Admitting: Internal Medicine

## 2022-04-22 ENCOUNTER — Encounter: Payer: Self-pay | Admitting: Internal Medicine

## 2022-04-22 VITALS — BP 128/82 | HR 77 | Ht 66.75 in | Wt 196.6 lb

## 2022-04-22 DIAGNOSIS — E063 Autoimmune thyroiditis: Secondary | ICD-10-CM | POA: Diagnosis not present

## 2022-04-22 DIAGNOSIS — E041 Nontoxic single thyroid nodule: Secondary | ICD-10-CM

## 2022-04-22 LAB — TSH: TSH: 0.89 u[IU]/mL (ref 0.35–5.50)

## 2022-04-22 LAB — T4, FREE: Free T4: 0.79 ng/dL (ref 0.60–1.60)

## 2022-04-22 LAB — T3, FREE: T3, Free: 3.5 pg/mL (ref 2.3–4.2)

## 2022-04-22 NOTE — Progress Notes (Signed)
Patient ID: Auriah Hollings, female   DOB: 10/21/1965, 56 y.o.   MRN: [id number]   HPI  Brekyn Huntoon is a 56 y.o.-year-old female, self-referred, for evaluation and management of a right thyroid nodule and euthyroid Hashimoto's thyroiditis.  She was previously seen by Dr. Jeanann Lewandowsky, who retired a few years ago. She moved to [place] in 2014.  Last  visit was 2 years and 4 months ago.  Interim history: Since last visit, she had no abnormal swallowing, choking, increased hoarseness. When she met her new PCP, Dr. Louis Matte, a new thyroid ultrasound was checked in 09/2021 and this showed increase in the thyroid nodule size.  Thyroid nodules  - dx'ed in 2007 by PCP by palpation. - reportedly had a benign Bx by PCP in 2007  Reviewed available study reports and images: Thyroid U/S (05/01/2015):  Right thyroid lobe: 6.1 x 2.9 x 3.0 cm. Complex predominantly solid lower pole mass measuring 4.0 x 2.9 x 3.1 cm. Left thyroid lobe: 4.1 x 1.5 x 1.4 cm.  No nodules. Isthmus: Thickness 3 mm.  No nodules visualized No lymphadenopathy.     05/20/2015: FNA of the right lower pole thyroid nodule: Benign  Thyroid ultrasound (12/12/2018): Parenchymal Echotexture: Mildly heterogenous Isthmus: Normal in size measures 0.3 cm in diameter, unchanged Right lobe: Enlarged measuring 6.2 x 3.2 x 2.4 cm, unchanged, previously, 6.1 x 2.9 x 3.0 cm Left lobe: Normal in size measuring 4.1 x 1.4 x 1.2 cm, unchanged, previously, 4.1 x 1.5 x 1.4 cm   The previously biopsied approximately 4.0 x 3.3 x 2.7 cm partially cystic though predominantly solid nodule/mass replacing the mid and inferior aspect the right lobe of the thyroid are unchanged compared to the 04/2015 examination, previously, 4.0 x 3.1 x 2.9 cm. Correlation with prior biopsy results is recommended.   IMPRESSION: 1. No new or enlarging thyroid nodules. 2. Previously biopsied nodule/mass replacing the mid and inferior aspect the right lobe of the thyroid are  unchanged compared to the 04/2015 examination. Correlation with prior biopsy results is recommended. Assuming a benign pathologic diagnosis, repeat sampling and/or continued dedicated follow-up is not recommended.  Thyroid ultrasound (10/05/2021): Parenchymal Echotexture: Moderately heterogenous  Isthmus: 0.3 cm  Right lobe: 7.1 x 4.6 x 5.0 cm  Left lobe: 4.3 x 1.3 x 1.6 cm  ____________________________________________________________________   Nodule # 1: Large heterogeneous isoechoic predominantly solid nodule with multifocal regions of internal cystic degeneration occupies the majority of the right mid and lower gland. The mass measures 5.6 x 4.6 x 5.0 cm. Prior measurement from June of 2020 4.0 x 3.3 x 2.7 cm.   IMPRESSION: Enlarging nodule/mass occupying the majority of the right mid and lower gland. [place]ortedly[place]. Recommend correlation with prior biopsy results. No new nodules or other abnormality identified within the thyroid.     Reviewed patient's TFTs: 09/14/2021: TSH 1.15, free T4 0.91, free T33.37, all normal Lab Results  Component Value Date   TSH 1.28 01/13/2021   TSH 0.58 01/07/2020   TSH 1.17 04/29/2019   TSH 0.77 11/01/2018   FREET4 0.87 01/13/2021   FREET4 0.78 01/07/2020   FREET4 0.74 04/29/2019   FREET4 0.83 11/01/2018  05/14/2015: TSH 0.907, total T3 92 (71-180), thyroglobulin 190.8 (1.5-38.5)  She reports a distant h/o thyrotoxicosis.  At that time, she was contemplating breast reduction surgery, but the surgery was put on hold due to the thyrotoxicosis.  She did not pursue this afterwards. She was on medication (? Name) for  her thyrotoxicosis >> resolved.  Hashimoto's thyroiditis.    Reviewed previous thyroid antibody levels: 09/14/2021: ATA <1, TPO antibodies 303 (0-34)  Component     Latest Ref Rng 01/13/2021  Thyroglobulin Ab     < or = 1 IU/mL <1   Thyroperoxidase Ab SerPl-aCnc     <9 IU/mL >900 (H)     Component     Latest Ref Rng 11/01/2018 04/29/2019 01/07/2020  Thyroglobulin Ab     < or = 1 IU/mL <1     Thyroperoxidase Ab SerPl-aCnc     <9 IU/mL 853 (H)  706 (H)  709 (H)   05/14/2015: TPO antibodies 246 (0-34), antithyroglobulin antibodies <1.0  We started selenium 200 mcg daily 10/2018.  She continues on this.  No family history of thyroid disease.  She has positive family history of autoimmune diseases (SLE, RA) mother. No FH of thyroid cancer. No h/o radiation tx to head or neck. No recent contrast studies. No herbal supplements. No Biotin use. No recent steroids use.   Pt also has a history of HL; R rotator cuff tear; anemia; dermoid cyst on R ovary. She is followed by Dr. Garwin Brothers.  ROS: + see HPI  I reviewed pt's medications, allergies, PMH, social hx, family hx, and changes were documented in the history of present illness. Otherwise, unchanged from my initial visit note.  Past Medical History:  Diagnosis Date   Thyroid disease    No past surgical history on file. Social History   Socioeconomic History   Marital status: Single    Spouse name: Not on file   Number of children: 1 son - healthy   Years of education: Not on file   Highest education level: Not on file  Occupational History   Cabin crew  Tobacco Use   Smoking status: Never Smoker   Smokeless tobacco: Never Used  Substance and Sexual Activity   Alcohol use: No   Drug use: No   Current Outpatient Medications  Medication Sig Dispense Refill   Selenium (SELENIMIN PO) Take by mouth.     No current facility-administered medications for this visit.   No Known Allergies   Family History  Problem Relation Age of Onset   Hypertension Mother    Hypertension Father    Cancer Father    Hypertension Brother    Diabetes Maternal Grandmother    Hypertension Maternal Grandmother    Cancer Maternal Grandfather    Please see pertinent FH in HPI.  PE: BP 128/82 (BP Location: Right Arm, Patient  Position: Sitting, Cuff Size: Normal)   Pulse 77   Ht 5' 6.75" (1.695 m)   Wt 196 lb 9.6 oz (89.2 kg)   LMP 02/16/2015   SpO2 98%   BMI 31.02 kg/m  Wt Readings from Last 3 Encounters:  04/22/22 196 lb 9.6 oz (89.2 kg)  01/07/20 192 lb 12.8 oz (87.5 kg)  04/29/19 195 lb (88.5 kg)   Constitutional: overweight, in NAD Eyes:  EOMI, no exophthalmos ENT: no neck masses, no cervical lymphadenopathy Cardiovascular: RRR, No MRG Respiratory: CTA B Musculoskeletal: no deformities Skin:no rashes Neurological: no tremor with outstretched hands  ASSESSMENT: 1. Thyroid nodule  2. Hashimoto's thyroiditis  PLAN: 1. Thyroid nodule -Returns after a longer absence of more than 2.5 years -Reviewed thyroid ultrasound report and images from 2016: The dominant nodule is large, this being a risk factor for cancer.  However, this was biopsied later that year with benign results.  The thyroid ultrasound from 12/2018 showed  stability of the nodule.  The nodule did not appear to be worrisome: It was solid + cystic and hyperplastic and hypoechoic.  It did not contain microcalcifications.  It had some internal blood flow but not much increased compared to background thyroid, more wide than tall, and without irregular margins.  Earlier this year she had another thyroid ultrasound (10/05/2021) which showed an increase in size of the right thyroid nodule.  Upon review of the ultrasound images, the increase in size is actually related to accumulation of more colloid, which is a benign feature.  The images were reviewed along with the patient. -She does not have thyroid cancer family history or personal history of radiation therapy to head or neck to increase her risk of thyroid cancer -She denies neck compression symptoms -we did discuss that if she develops neck compression symptoms, we can aspirate the fluid.  I would not recommend hemithyroidectomy since, in the setting of Hashimoto's thyroiditis, there is a higher  chance of evolving towards postsurgical hypothyroidism.  I did reassure her that the presence of the nodule is not a contraindication to surgery. -I will see her back 1 year -we may repeat the ultrasound at next visit if she develops neck compression symptoms  2. Hashimoto's thyroiditis -Patient with euthyroid Hashimoto's thyroiditis -TPO antibodies were elevated in the past -she was on selenium, which we used to help decrease her antibodies >> off now -She is asymptomatic, without complaints at today's visit - TFTs from 09/2021 [facility]) >> rnormal -We will repeat her TFTs today  Component     Latest Ref Rng 04/22/2022  Triiodothyronine,Free,Serum     2.3 - 4.2 pg/mL 3.5   T4,Free(Direct)     0.60 - 1.60 ng/dL 0.79   TSH     0.35 - 5.50 uIU/mL 0.89   Normal TFTs.  Philemon Kingdom, MD PhD [facility] Endocrinology

## 2022-04-22 NOTE — Patient Instructions (Addendum)
Please have Dr. Louis Matte send me the thyroid labs.  You should have an endocrinology follow-up appointment in 1 year.

## 2022-05-06 ENCOUNTER — Other Ambulatory Visit: Payer: Self-pay | Admitting: General Surgery

## 2022-05-06 DIAGNOSIS — R1032 Left lower quadrant pain: Secondary | ICD-10-CM

## 2022-05-18 ENCOUNTER — Ambulatory Visit
Admission: RE | Admit: 2022-05-18 | Discharge: 2022-05-18 | Discharge status: Home / Self Care | Payer: Managed Care, Other (non HMO) | Source: Ambulatory Visit | Attending: General Surgery | Admitting: General Surgery

## 2022-05-18 DIAGNOSIS — R1032 Left lower quadrant pain: Secondary | ICD-10-CM

## 2022-08-19 IMAGING — US US PELVIS LIMITED
1 series · 13 of 13 positions shown · non-contrast
Comparison: None.

CLINICAL DATA: Left groin mass

EXAM:
LIMITED ULTRASOUND OF PELVIS
TECHNIQUE: Limited transabdominal ultrasound examination of the pelvis was
performed.

[Series 1: us pelvis limited · 0.05mm/px · 13 acquisitions, 13 frames shown]
[im 1/13]
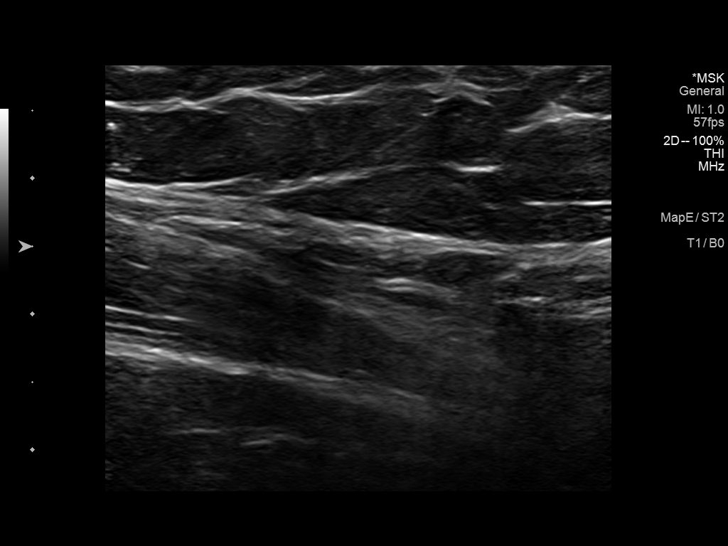
[im 2/13]
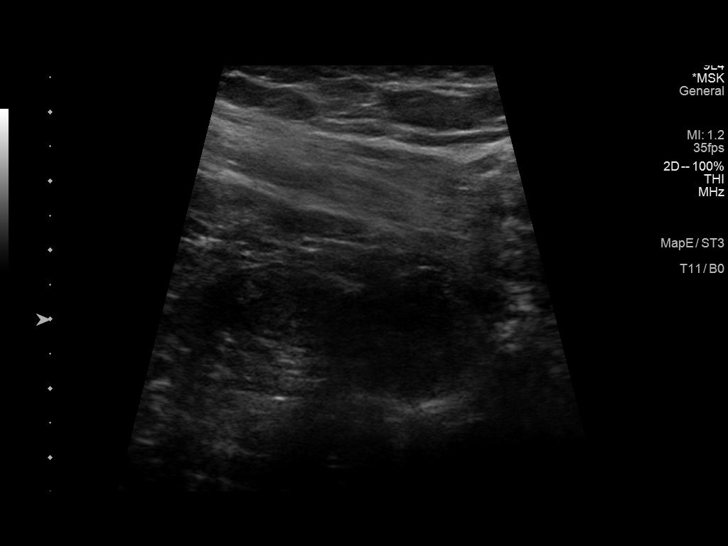
[im 3/13]
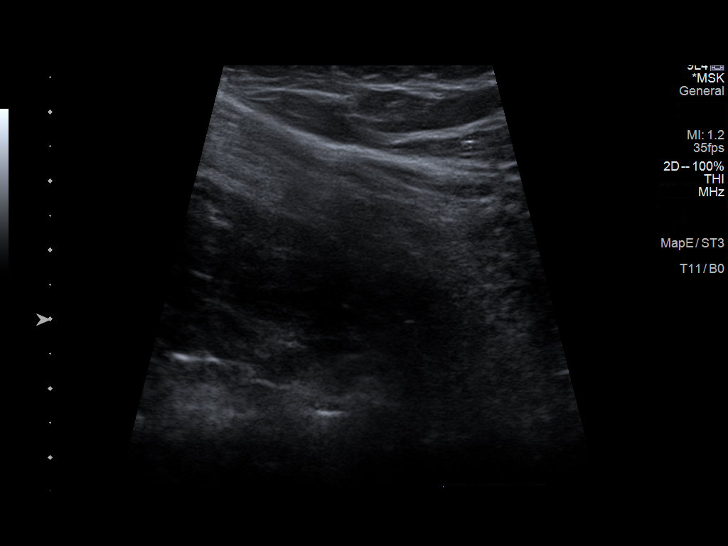
[im 4/13]
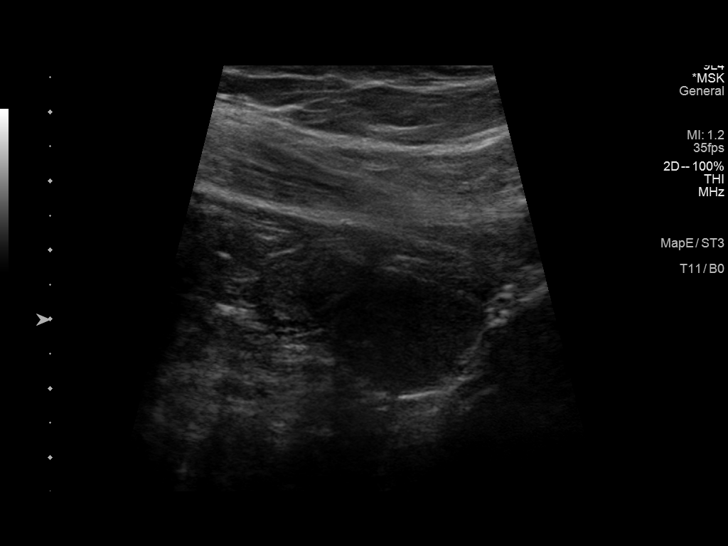
[im 5/13]
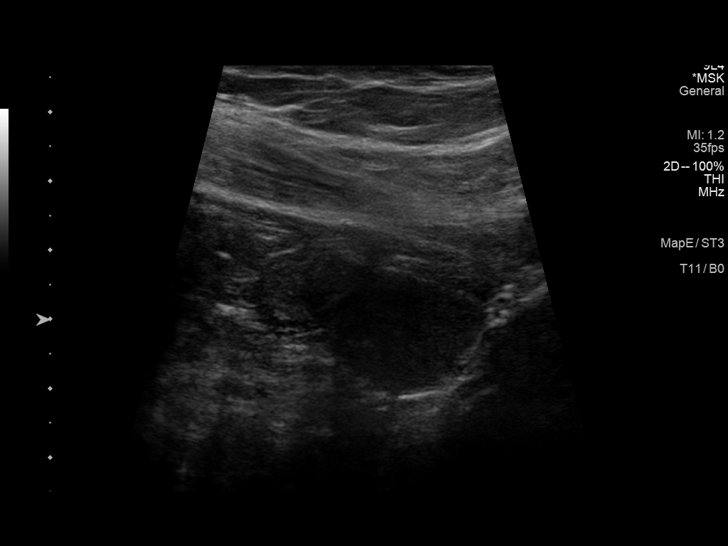
[im 6/13]
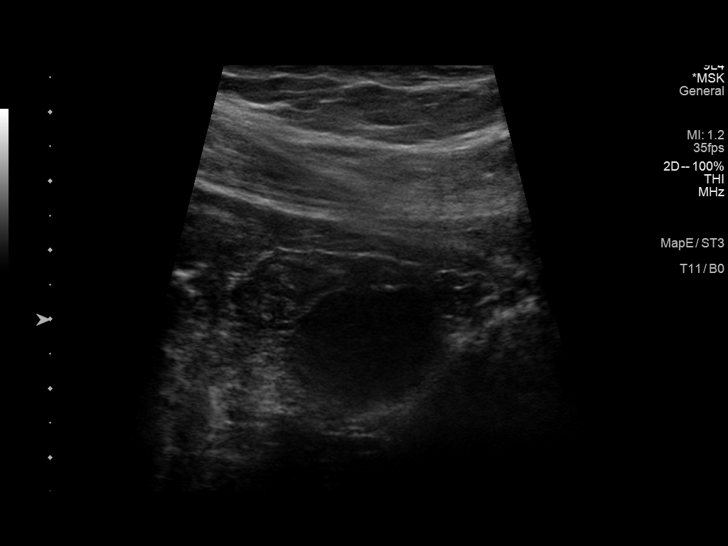
[im 7/13]
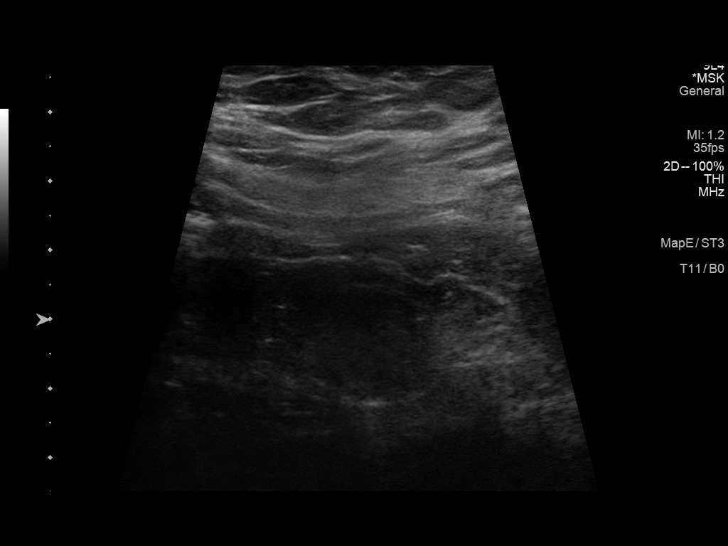
[im 8/13]
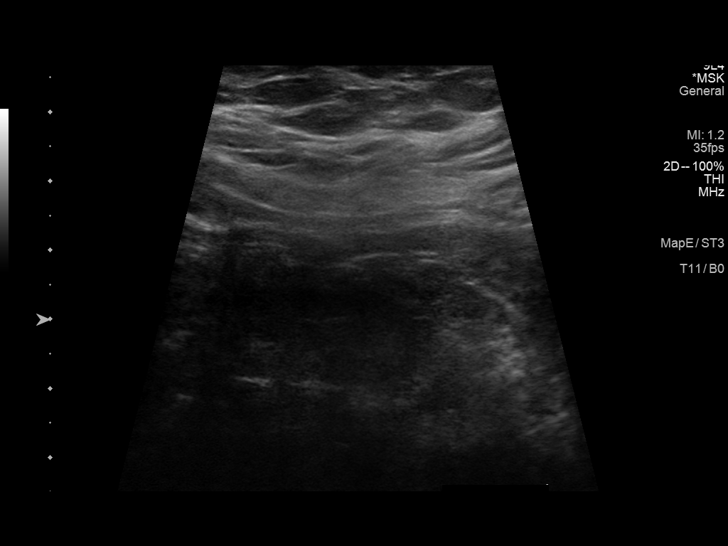
[im 9/13]
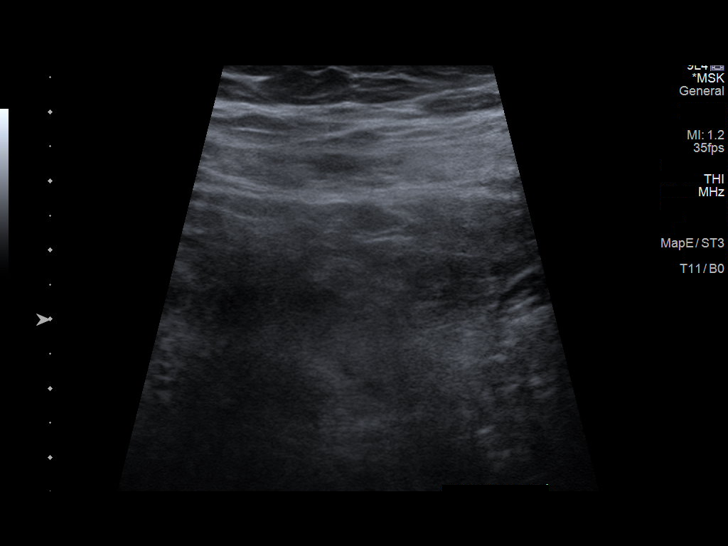
[im 10/13]
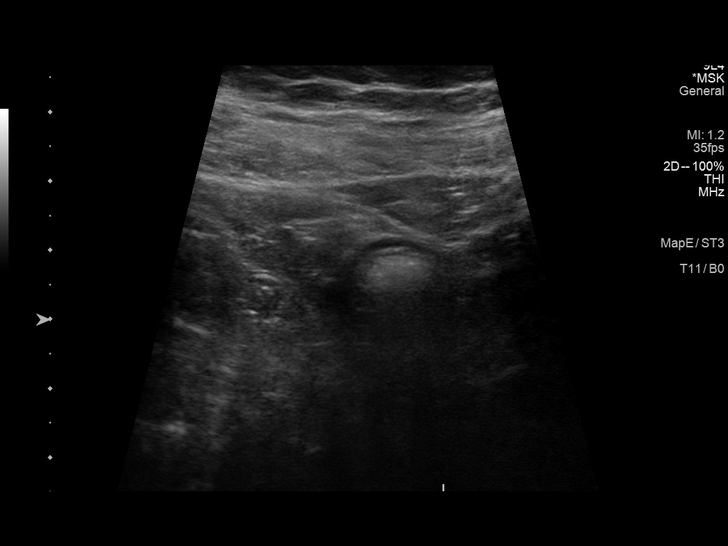
[im 11/13]
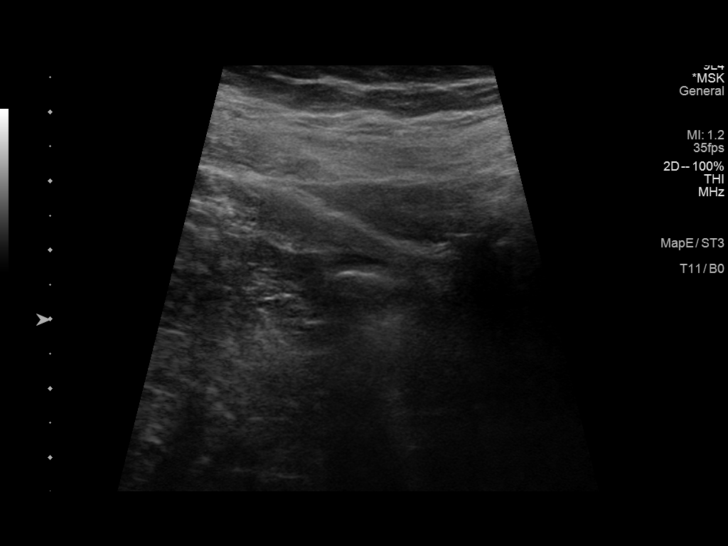
[im 12/13]
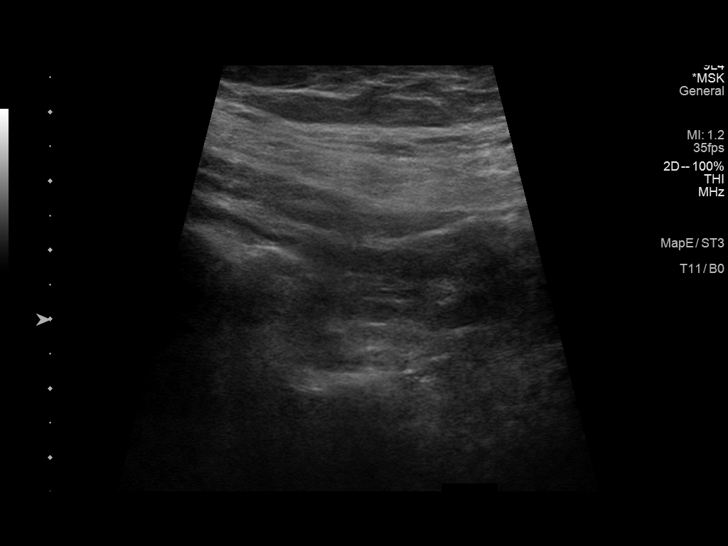
[im 13/13]
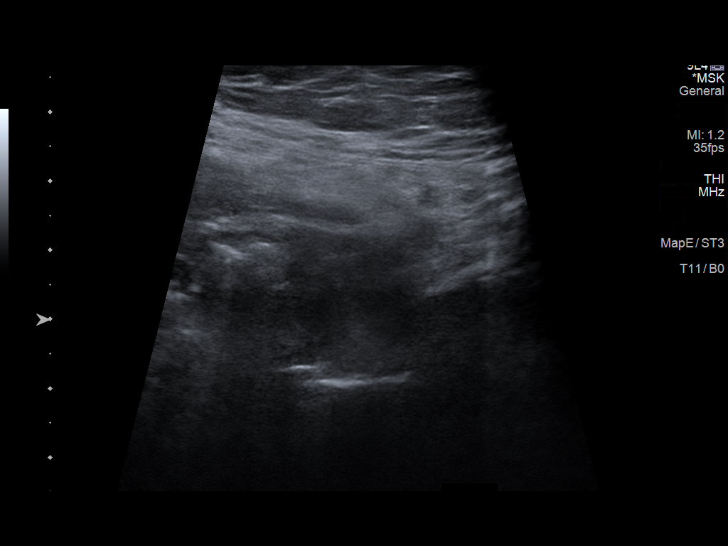

[13 of 13 positions shown; findings below may reference images not displayed]

FINDINGS: There are changes consistent with a left inguinal hernia which
contains bowel and fat. No findings to suggest incarceration are
noted. The need for further follow-up by CT can be determined on a
clinical basis.
IMPRESSION: Left inguinal hernia as described

## 2022-08-19 IMAGING — US US THYROID
1 series · 13 of 25 positions shown · non-contrast
Comparison: Prior thyroid ultrasound 12/12/2018

CLINICAL DATA: Goiter. History of right-sided thyroid nodule which
was previously biopsied in [HOSPITAL].

EXAM:
THYROID ULTRASOUND
TECHNIQUE: Ultrasound examination of the thyroid gland and adjacent soft
tissues was performed.

[Series 1: us thyroid · 0.04mm/px · 13 of 60 slices shown]
[im 1/60]
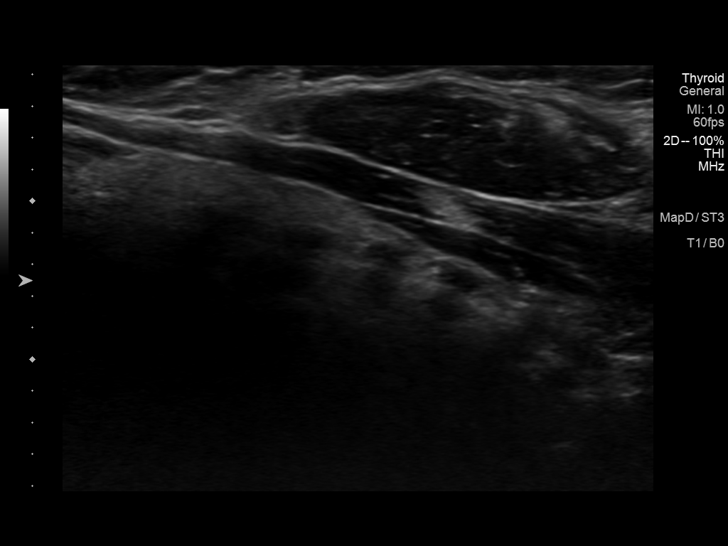
[im 5/60]
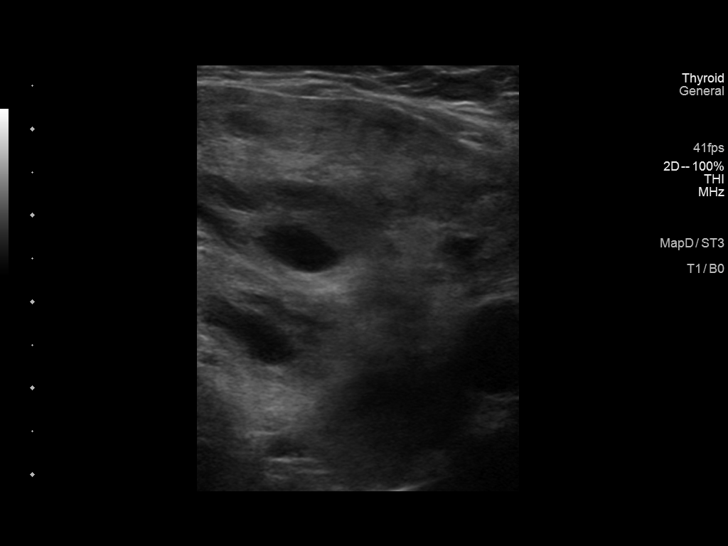
[im 10/60]
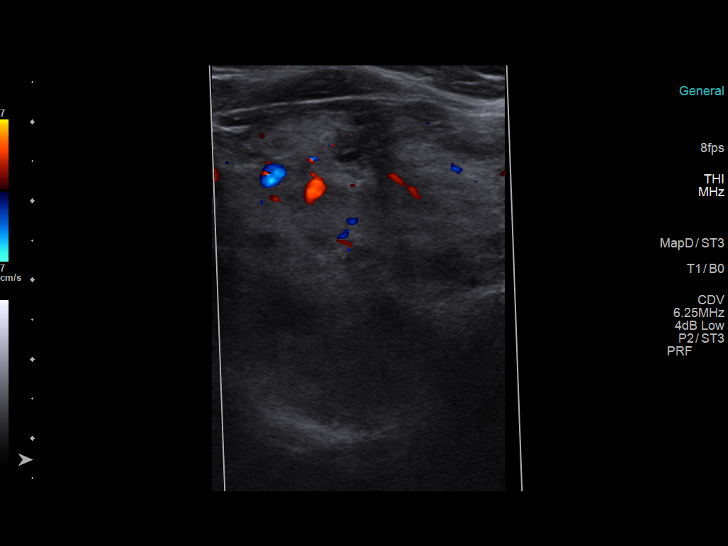
[im 15/60]
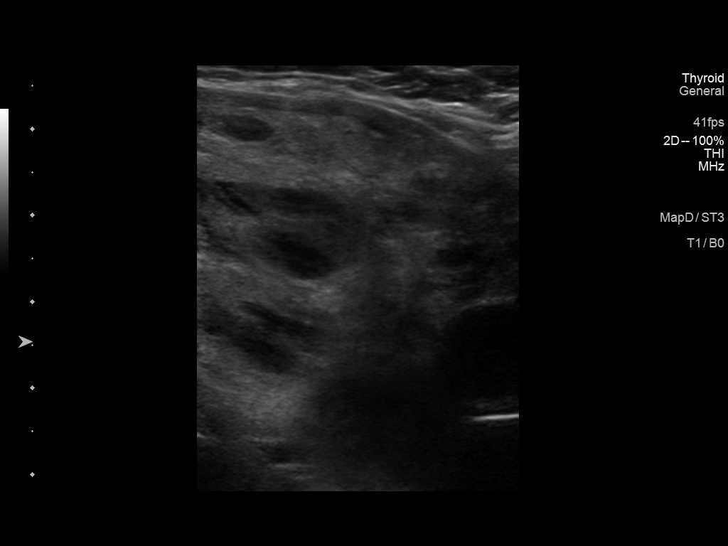
[im 20/60]
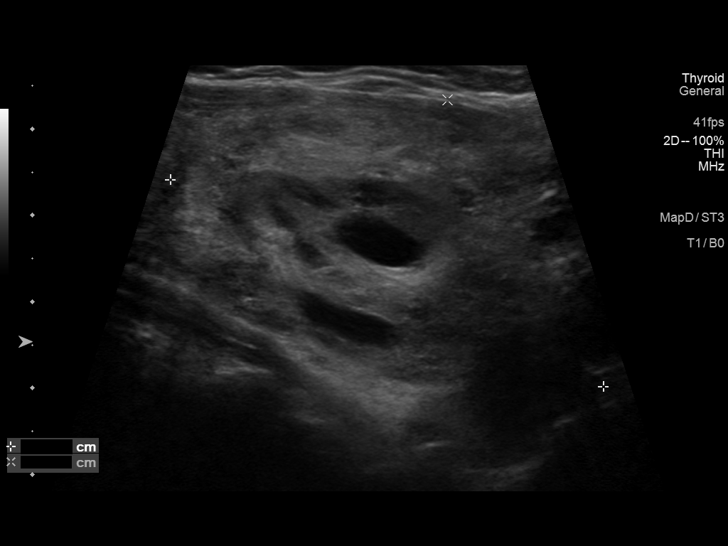
[im 25/60]
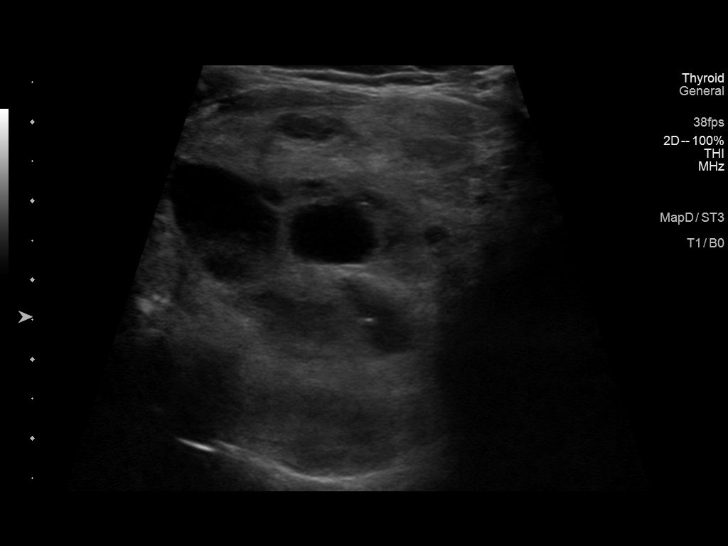
[im 30/60]
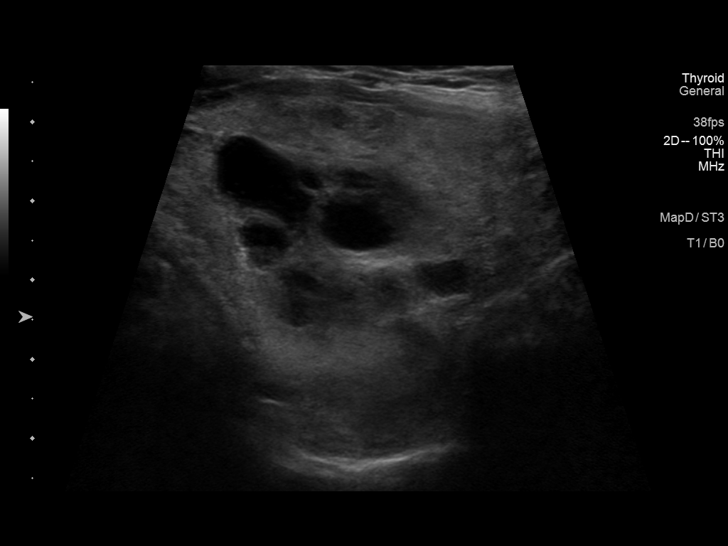
[im 35/60]
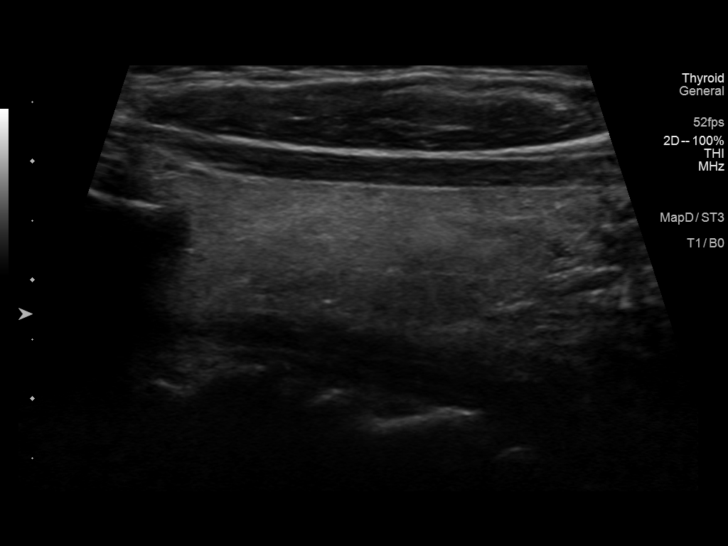
[im 40/60]
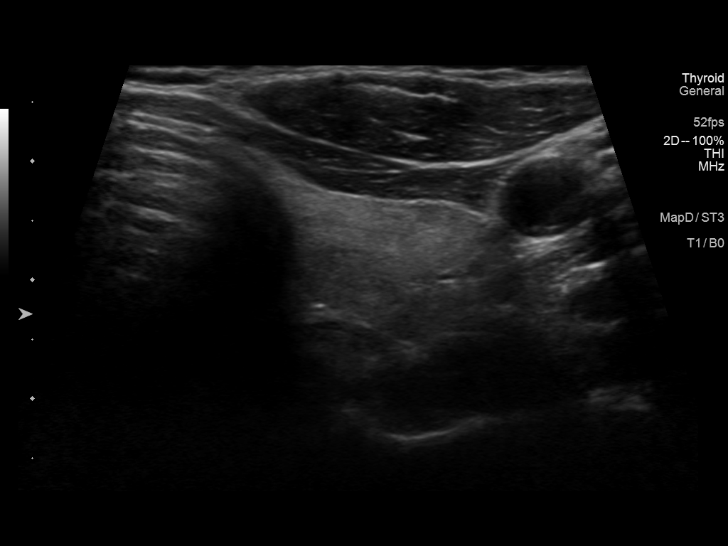
[im 45/60]
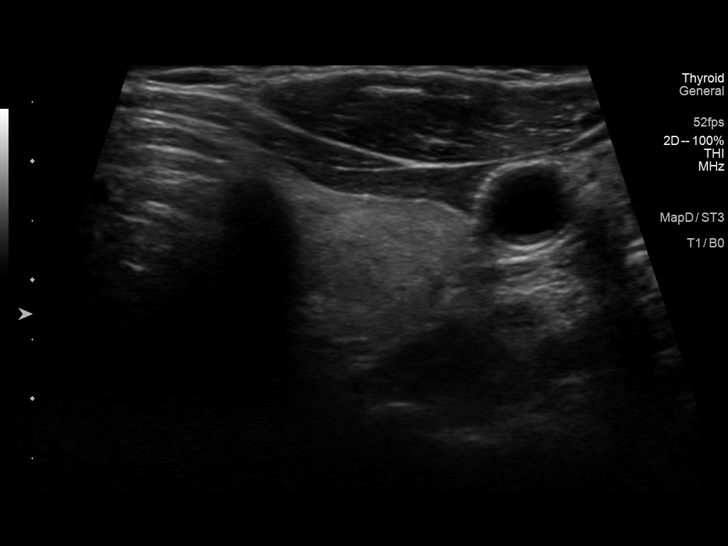
[im 50/60]
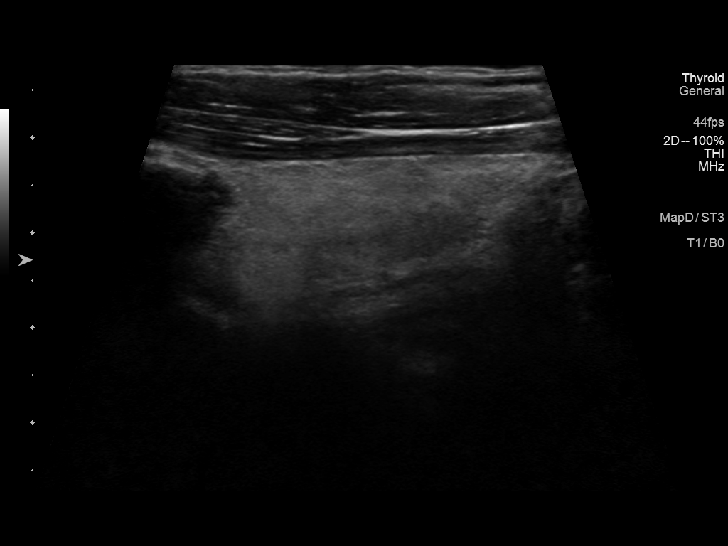
[im 55/60]
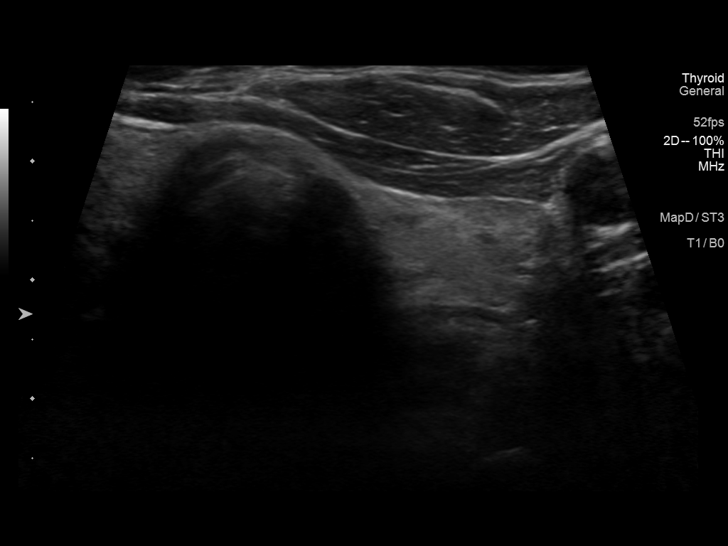
[im 60/60]
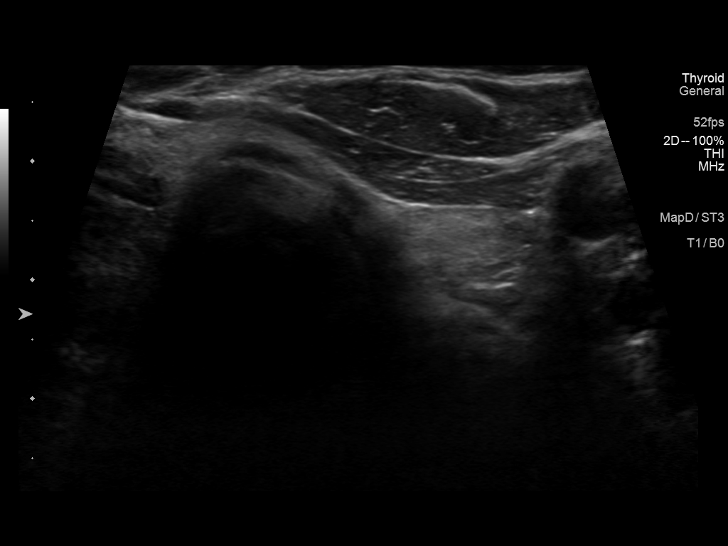

[13 of 25 positions shown; findings below may reference images not displayed]

FINDINGS: Parenchymal Echotexture: Moderately heterogenous

Isthmus: 0.3 cm

Right lobe: 7.1 x 4.6 x 5.0 cm

Left lobe: 4.3 x 1.3 x 1.6 cm

_________________________________________________________

Estimated total number of nodules >/= 1 cm: 1

Number of spongiform nodules >/=  2 cm not described below (TR1): 0

Number of mixed cystic and solid nodules >/= 1.5 cm not described
below (TR2): 0

_________________________________________________________

Nodule # 1: Large heterogeneous isoechoic predominantly solid nodule
with multifocal regions of internal cystic degeneration occupies the
majority of the right mid and lower gland. The mass measures 5.6 x
4.6 x 5.0 cm. Prior measurement from Monday December, 2018 4.0 x 3.3 x
cm.
IMPRESSION: Enlarging nodule/mass occupying the majority of the right mid and
lower gland. Reportedly, [place].
Recommend correlation with prior biopsy results.

No new nodules or other abnormality identified within the thyroid.

## 2023-10-02 DIAGNOSIS — E063 Autoimmune thyroiditis: Secondary | ICD-10-CM | POA: Diagnosis not present

## 2023-10-02 DIAGNOSIS — Z0000 Encounter for general adult medical examination without abnormal findings: Secondary | ICD-10-CM | POA: Diagnosis not present

## 2023-10-02 DIAGNOSIS — E559 Vitamin D deficiency, unspecified: Secondary | ICD-10-CM | POA: Diagnosis not present

## 2023-10-02 DIAGNOSIS — D649 Anemia, unspecified: Secondary | ICD-10-CM | POA: Diagnosis not present

## 2023-10-02 DIAGNOSIS — E041 Nontoxic single thyroid nodule: Secondary | ICD-10-CM | POA: Diagnosis not present

## 2023-10-02 DIAGNOSIS — R7303 Prediabetes: Secondary | ICD-10-CM | POA: Diagnosis not present

## 2023-10-02 DIAGNOSIS — Z01419 Encounter for gynecological examination (general) (routine) without abnormal findings: Secondary | ICD-10-CM | POA: Diagnosis not present

## 2023-10-02 DIAGNOSIS — E7800 Pure hypercholesterolemia, unspecified: Secondary | ICD-10-CM | POA: Diagnosis not present

## 2023-10-02 DIAGNOSIS — D259 Leiomyoma of uterus, unspecified: Secondary | ICD-10-CM | POA: Diagnosis not present

## 2023-10-02 DIAGNOSIS — Z780 Asymptomatic menopausal state: Secondary | ICD-10-CM | POA: Diagnosis not present
